# Patient Record
Sex: Female | Born: 1935 | Race: Asian | Hispanic: No | Smoking: Never smoker
Health system: Southern US, Community
[De-identification: ages and names within clinical notes are randomized; demographics above are authoritative.]

## PROBLEM LIST (undated history)

## (undated) DIAGNOSIS — E785 Hyperlipidemia, unspecified: Secondary | ICD-10-CM

## (undated) DIAGNOSIS — E119 Type 2 diabetes mellitus without complications: Secondary | ICD-10-CM

## (undated) DIAGNOSIS — I10 Essential (primary) hypertension: Secondary | ICD-10-CM

## (undated) HISTORY — PX: EYE SURGERY: SHX253

## (undated) HISTORY — PX: CARDIAC CATHETERIZATION: SHX172

## (undated) HISTORY — PX: BACK SURGERY: SHX140

## (undated) HISTORY — PX: KNEE SURGERY: SHX244

---

## 1999-05-14 ENCOUNTER — Other Ambulatory Visit: Admission: RE | Admit: 1999-05-14 | Discharge: 1999-05-14 | Payer: Self-pay | Admitting: Family Medicine

## 1999-08-19 ENCOUNTER — Emergency Department (HOSPITAL_COMMUNITY): Admission: EM | Admit: 1999-08-19 | Discharge: 1999-08-19 | Payer: Self-pay | Admitting: Emergency Medicine

## 1999-08-19 ENCOUNTER — Encounter: Payer: Self-pay | Admitting: Cardiology

## 1999-11-26 ENCOUNTER — Encounter: Payer: Self-pay | Admitting: Family Medicine

## 1999-11-26 ENCOUNTER — Ambulatory Visit (HOSPITAL_COMMUNITY): Admission: RE | Admit: 1999-11-26 | Discharge: 1999-11-26 | Payer: Self-pay | Admitting: Family Medicine

## 2000-02-19 ENCOUNTER — Inpatient Hospital Stay (HOSPITAL_COMMUNITY): Admission: EM | Admit: 2000-02-19 | Discharge: 2000-02-20 | Payer: Self-pay | Admitting: Emergency Medicine

## 2000-02-19 ENCOUNTER — Encounter: Payer: Self-pay | Admitting: Emergency Medicine

## 2000-02-20 ENCOUNTER — Encounter: Payer: Self-pay | Admitting: Endocrinology

## 2001-09-10 ENCOUNTER — Other Ambulatory Visit: Admission: RE | Admit: 2001-09-10 | Discharge: 2001-09-10 | Payer: Self-pay | Admitting: Family Medicine

## 2006-12-07 ENCOUNTER — Encounter: Admission: RE | Admit: 2006-12-07 | Discharge: 2006-12-07 | Payer: Self-pay | Admitting: Orthopedic Surgery

## 2006-12-08 ENCOUNTER — Encounter: Admission: RE | Admit: 2006-12-08 | Discharge: 2006-12-08 | Payer: Self-pay | Admitting: Orthopedic Surgery

## 2006-12-09 ENCOUNTER — Ambulatory Visit (HOSPITAL_BASED_OUTPATIENT_CLINIC_OR_DEPARTMENT_OTHER): Admission: RE | Admit: 2006-12-09 | Discharge: 2006-12-09 | Payer: Self-pay | Admitting: Orthopedic Surgery

## 2009-05-21 ENCOUNTER — Encounter: Admission: RE | Admit: 2009-05-21 | Discharge: 2009-05-21 | Payer: Self-pay | Admitting: Family Medicine

## 2009-12-17 ENCOUNTER — Emergency Department (HOSPITAL_BASED_OUTPATIENT_CLINIC_OR_DEPARTMENT_OTHER): Admission: EM | Admit: 2009-12-17 | Discharge: 2009-12-17 | Payer: Self-pay | Admitting: Emergency Medicine

## 2009-12-17 ENCOUNTER — Ambulatory Visit: Payer: Self-pay | Admitting: Diagnostic Radiology

## 2010-09-17 NOTE — Op Note (Signed)
NAME:  Stacy Reid, Stacy Reid                     ACCOUNT NO.:  [id number]   MEDICAL RECORD NO.:  [id number]          PATIENT TYPE:  AMB   LOCATION:  DSC                          FACILITY:  [facility]   PHYSICIAN:  Mila Homer. Sherlean Foot, M.D. DATE OF BIRTH:  10/08/1932   DATE OF PROCEDURE:  12/09/2006  DATE OF DISCHARGE:                               OPERATIVE REPORT   PREOPERATIVE DIAGNOSIS:  Left patella fracture.   POSTOPERATIVE DIAGNOSIS:  Left patella fracture.   PROCEDURE:  Left patella open reduction and internal fixation.   SURGEON:  Mila Homer. Sherlean Foot, M.D.   ASSISTANT:  None.   ANESTHESIA:  General.   INDICATIONS FOR PROCEDURE:  The patient is a 77 year old female who fell  4 days ago suffering a patella fracture.  Informed consent was obtained.   PROCEDURE:  The patient was placed in supine position and anesthetized  with general anesthesia.  The extremity was exsanguinated with the  Esmarch and the tourniquet inflated to 30 mmHg.   I made a midline incision approximately 4 inches in length.  I used a  fresh blade to go down to and through the paratenon.  I then lavaged out  the knee and cleaned out the fracture site with a rongeur.  Once I had  good bony bleeding edges to the fracture, I reduced it with a towel  clamp.  I then ran two 1.6 mm K-wires in parallel fashion and checked it  under the [facility] device.  I then placed a #16 gauge wire in figure-of-eight  fashion and twisted down and got it tight for an arc of 0 to 60 or 70  degrees of motion, and it was very very stable.  I then curved the  proximal end of the K-wires and pulled them down distally, embedded the  hooks into the quadriceps tendon in parallel fashion and then cut the  pins distally.  I checked on the [facility] imaging to make sure things were  lined up well in AP and lateral images, and then lavaged and closed the  rent in the capsule with #1 Vicryl sutures, the paratenon with #0 Vicryl  suture, deep soft tissue with buried #0  Vicryl, then subcuticular with 2-  0 Vicryl sutures and skin staples, then dressed with Xeroform, sponge,  sterile Webril and an Ace wrap.   Complications: None.  Drains: None.           ______________________________  Mila Homer. Sherlean Foot, M.D.     SDL/MEDQ  D:  12/09/2006  T:  12/10/2006  Job:  [id number]

## 2010-09-20 NOTE — Discharge Summary (Signed)
[place]  Patient:    Stacy Reid, Stacy Reid                            MRN: [id number] Adm. Date:  [id number] Disc. Date: [id number] Attending:  Justine Null Dictator:   Cornell Barman, P.A. CC:         Evette Georges, M.D. [facility]                           Discharge Summary  DISCHARGE DIAGNOSIS:  Chest pain, myocardial infarction ruled out.  HISTORY OF PRESENT ILLNESS:  Stacy Reid is a 77 year old [place] female with no prior history of coronary artery disease.  She presented with episodic chest pain without any associated exertional activity.  The patients pain is substernal, without radiation, rated a 7/10.  She does have some shortness of breath but no nausea, vomiting, or diaphoresis.  PAST MEDICAL HISTORY: 1. Hypertension. 2. History of catheterization in 1995 or 1996, revealing normal coronaries. 3. Long history of anxiety.  HOSPITAL COURSE: #1 - CHEST PAIN WITH HISTORY OF NORMAL CORONARIES IN 1996:  Cardiac enzymes are negative.  EKG is without ischemia.  The patient did rule out for myocardial infarction.  The patient was scheduled for an adenosine Cardiolite. The final results are pending.  If these are negative, she will be discharged home.  #2 - GASTROESOPHAGEAL REFLUX DISEASE:  The patient has been on Vioxx three times a day, which may have produced some gastritis.  The patients Vioxx has been discontinued, and we have given her a prescription of Protonix.  FOLLOW-UP:  With Dr. Tawanna Cooler in the next 7-10 days. DD:  02/20/00 TD:  02/21/00 Job: [id number]

## 2010-09-20 NOTE — Consult Note (Signed)
[place]  Patient:    GOLDYE, TOURANGEAU                            MRN: [id number] Proc. Date: 08/19/99 Adm. Date:  [id number] Attending:  Otilio Connors Iv CC:         Evette Georges, M.D. [facility]             Lesle Chris, M.d.                          Consultation Report  HISTORY OF PRESENT ILLNESS:  Ms. Boteler is a 77 year old [place] female who presents for evaluation of chest pain today.  Initially the patient developed nausea and lightheadedness this morning, followed by symptoms of substernal chest discomfort and some dyspnea.  She was seen in [facility] where an electrocardiogram showed some slight increase in T-wave changes in V3 and V4. he was referred to the emergency room for further evaluation.  The patient currently feels fine.  She is pain-free.  She reports having similar symptoms off and on before.  In fact, she was admitted in January 1996, with chest pain, and had a normal cardiac catheterization at that time.  She denies any vomiting or abdominal pain.  PAST MEDICAL HISTORY:  Hypertension.  PAST SURGICAL HISTORY:  She has had no prior surgery.  ALLERGIES:  No known drug allergies.  CURRENT MEDICATIONS:  Lotensin/HCT for her hypertension.  SOCIAL HISTORY:  The patient is [place]  Her husband expired two months ago. She works at a [employer].  Her son is with her today.  FAMILY HISTORY:  Noncontributory.  REVIEW OF SYSTEMS:  The patient complains of chronic foot pain.  Apparently she was evaluated at a hospital in [place] two months ago for this.  PHYSICAL EXAMINATION:  GENERAL:  The patient is a pleasant [place] female, in no apparent distress.  VITAL SIGNS:  Blood pressure 158/84, pulse 68 and regular, temperature afebrile.  HEENT:  Unremarkable.  She has no jugular venous distention or bruits.  LUNGS:  Clear.  CARDIAC:  A regular rate and rhythm without murmurs, rubs,  gallops, or clicks.  ABDOMEN:  Obese, soft, nontender.  Bowel sounds are positive.  No masses.  EXTREMITIES:  femoral and pedal pulses are 2+ and symmetric.  She has no cyanosis, edema, or phlebitis.  NEUROLOGIC:  Grossly intact.  LABORATORY DATA:  Chest x-ray showed no active disease.  The electrocardiogram shows a normal sinus rhythm with nonspecific T-wave abnormalities.  In fact, reviewing the electrocardiograms from 1995, 1997, and today, there seems to be a varying degree of nonspecific T-wave abnormalities.  Chest x-ray shows no active disease.  CBC, chemistries are normal, with the exception of potassium of 3.4.  Amylase normal, CPK is normal at 77.  Troponin-I pending.  Abdominal ultrasound was obtained today, which demonstrates no gallstones.  The  pancreas is poorly visualized.  No acute abnormality.  IMPRESSION: 1. Atypical chest pain, preceded by nausea.  I doubt this is cardiac, especially    given the fact that she had a normal cardiac catheterization in 1996. Her    nonspecific T-wave abnormalities are chronic.  I suspect this is more related    to gastroesophageal reflux disease, possibly exacerbated by recent stress of  her husband passing away two months ago. 2. Hypertension.  PLAN:  Will start the patient on Protonix 40  mg q.d.  Will discharge to home and have her follow up with her primary physician, Dr. Evette Georges. DD:  08/19/99 TD:  08/19/99 Job: [id number]

## 2011-02-17 LAB — POCT HEMOGLOBIN-HEMACUE
Hemoglobin: 15.2 — ABNORMAL HIGH
Operator id: 123881

## 2011-02-17 LAB — BASIC METABOLIC PANEL
Calcium: 9.7
GFR calc Af Amer: >60
GFR calc non Af Amer: >60
Potassium: 4.2
Sodium: 140

## 2013-11-29 ENCOUNTER — Emergency Department (HOSPITAL_BASED_OUTPATIENT_CLINIC_OR_DEPARTMENT_OTHER)
Admission: EM | Admit: 2013-11-29 | Discharge: 2013-11-29 | Disposition: A | Discharge status: Home / Self Care | Payer: Medicare Other | Attending: Emergency Medicine | Admitting: Emergency Medicine

## 2013-11-29 ENCOUNTER — Emergency Department (HOSPITAL_BASED_OUTPATIENT_CLINIC_OR_DEPARTMENT_OTHER): Payer: Medicare Other

## 2013-11-29 ENCOUNTER — Encounter (HOSPITAL_BASED_OUTPATIENT_CLINIC_OR_DEPARTMENT_OTHER): Payer: Self-pay | Admitting: Emergency Medicine

## 2013-11-29 DIAGNOSIS — R509 Fever, unspecified: Secondary | ICD-10-CM | POA: Insufficient documentation

## 2013-11-29 DIAGNOSIS — I10 Essential (primary) hypertension: Secondary | ICD-10-CM | POA: Insufficient documentation

## 2013-11-29 DIAGNOSIS — J159 Unspecified bacterial pneumonia: Secondary | ICD-10-CM | POA: Insufficient documentation

## 2013-11-29 DIAGNOSIS — E119 Type 2 diabetes mellitus without complications: Secondary | ICD-10-CM | POA: Diagnosis not present

## 2013-11-29 DIAGNOSIS — Z79899 Other long term (current) drug therapy: Secondary | ICD-10-CM | POA: Insufficient documentation

## 2013-11-29 DIAGNOSIS — E785 Hyperlipidemia, unspecified: Secondary | ICD-10-CM | POA: Diagnosis not present

## 2013-11-29 DIAGNOSIS — J189 Pneumonia, unspecified organism: Secondary | ICD-10-CM

## 2013-11-29 HISTORY — DX: Type 2 diabetes mellitus without complications: E11.9

## 2013-11-29 HISTORY — DX: Hyperlipidemia, unspecified: E78.5

## 2013-11-29 HISTORY — DX: Essential (primary) hypertension: I10

## 2013-11-29 LAB — COMPREHENSIVE METABOLIC PANEL
ALT: 6 U/L (ref 0–35)
AST: 23 U/L (ref 0–37)
Albumin: 3.6 g/dL (ref 3.5–5.2)
Alkaline Phosphatase: 83 U/L (ref 39–117)
Anion gap: 14 (ref 5–15)
BUN: 14 mg/dL (ref 6–23)
CALCIUM: 8.7 mg/dL (ref 8.4–10.5)
CO2: 24 meq/L (ref 19–32)
Chloride: 96 mEq/L (ref 96–112)
Creatinine, Ser: 0.9 mg/dL (ref 0.50–1.10)
GFR calc Af Amer: 69 mL/min — ABNORMAL LOW (ref >90)
GFR, EST NON AFRICAN AMERICAN: 60 mL/min — AB (ref >90)
Glucose, Bld: 341 mg/dL — ABNORMAL HIGH (ref 70–99)
Potassium: 3.9 mEq/L (ref 3.7–5.3)
SODIUM: 134 meq/L — AB (ref 137–147)
Total Bilirubin: 0.5 mg/dL (ref 0.3–1.2)
Total Protein: 7.6 g/dL (ref 6.0–8.3)

## 2013-11-29 LAB — CBC
HCT: 43.4 % (ref 36.0–46.0)
Hemoglobin: 14.5 g/dL (ref 12.0–15.0)
MCH: 30 pg (ref 26.0–34.0)
MCHC: 33.4 g/dL (ref 30.0–36.0)
MCV: 89.9 fL (ref 78.0–100.0)
PLATELETS: 216 10*3/uL (ref 150–400)
RBC: 4.83 MIL/uL (ref 3.87–5.11)
RDW: 12.2 % (ref 11.5–15.5)
WBC: 11.2 10*3/uL — AB (ref 4.0–10.5)

## 2013-11-29 LAB — URINALYSIS, ROUTINE W REFLEX MICROSCOPIC
BILIRUBIN URINE: NEGATIVE
GLUCOSE, UA: NEGATIVE mg/dL
HGB URINE DIPSTICK: NEGATIVE
Ketones, ur: 15 mg/dL — AB
Leukocytes, UA: NEGATIVE
NITRITE: NEGATIVE
PH: 5.5 (ref 5.0–8.0)
Protein, ur: 100 mg/dL — AB
SPECIFIC GRAVITY, URINE: 1.023 (ref 1.005–1.030)
Urobilinogen, UA: 1 mg/dL (ref 0.0–1.0)

## 2013-11-29 LAB — I-STAT CG4 LACTIC ACID, ED: Lactic Acid, Venous: 1.75 mmol/L (ref 0.5–2.2)

## 2013-11-29 LAB — URINE MICROSCOPIC-ADD ON

## 2013-11-29 LAB — CBG MONITORING, ED: Glucose-Capillary: 265 mg/dL — ABNORMAL HIGH (ref 70–99)

## 2013-11-29 MED ORDER — AZITHROMYCIN 250 MG PO TABS: 250.0000 mg | ORAL_TABLET | Freq: Every day | ORAL | Status: DC

## 2013-11-29 MED ORDER — AMOXICILLIN 500 MG PO CAPS: 1000.0000 mg | ORAL_CAPSULE | Freq: Two times a day (BID) | ORAL | Status: DC

## 2013-11-29 MED ORDER — SODIUM CHLORIDE 0.9 % IV BOLUS (SEPSIS)
1000.0000 mL | Freq: Once | INTRAVENOUS | Status: AC
  Administered 2013-11-29: 1000 mL via INTRAVENOUS

## 2013-11-29 MED ORDER — AZITHROMYCIN 250 MG PO TABS
500.0000 mg | ORAL_TABLET | Freq: Once | ORAL | Status: AC
  Administered 2013-11-29: 500 mg via ORAL
  Filled 2013-11-29: qty 2

## 2013-11-29 MED ORDER — DEXTROSE 5 % IV SOLN: 1.0000 g | Freq: Once | INTRAVENOUS | Status: DC

## 2013-11-29 MED ORDER — IBUPROFEN 400 MG PO TABS
600.0000 mg | ORAL_TABLET | Freq: Once | ORAL | Status: AC
  Administered 2013-11-29: 600 mg via ORAL
  Filled 2013-11-29 (×2): qty 1

## 2013-11-29 MED ORDER — CEFTRIAXONE SODIUM 1 G IJ SOLR
INTRAMUSCULAR | Status: AC
  Administered 2013-11-29: 1000 mg
  Filled 2013-11-29: qty 10

## 2013-11-29 NOTE — ED Notes (Signed)
Patient transported to X-ray 

## 2013-11-29 NOTE — ED Notes (Signed)
Fever that started 2 days ago.  Traveled internationally from [place] 6 days ago and was well.  Abdominal and back pain, vomited x 1 this am.

## 2013-11-29 NOTE — ED Provider Notes (Signed)
CSN: [id number]     Arrival date & time 11/29/13  1478 History   First MD Initiated Contact with Patient 11/29/13 1009     Chief Complaint  Patient presents with  . Fever     (Consider location/radiation/quality/duration/timing/severity/associated sxs/prior Treatment) HPI Pt presenting with fever, symptoms began 2 days ago.  She denies other symptoms including no sore throat, no cough.  She states that she felt nauseated and had one episode of emesis.  Nonbloody and nonbilious.  No diarrhea.  Currently denies pain in abdomen.  She recently traveled back from [place], no signs of illness there, no specific known sick contacts.  There are no other associated systemic symptoms, there are no other alleviating or modifying factors.   Past Medical History  Diagnosis Date  . Diabetes mellitus without complication   . Hypertension   . Hyperlipidemia    Past Surgical History  Procedure Laterality Date  . Knee surgery    . Eye surgery    . Cardiac catheterization     No family history on file. History  Substance Use Topics  . Smoking status: Never Smoker   . Smokeless tobacco: Not on file  . Alcohol Use: No   OB History   Grav Para Term Preterm Abortions TAB SAB Ect Mult Living                 Review of Systems ROS reviewed and all otherwise negative except for mentioned in HPI    Allergies  Review of patient's allergies indicates no known allergies.  Home Medications   Prior to Admission medications   Medication Sig Start Date End Date Taking? Authorizing Provider  losartan (COZAAR) 50 MG tablet Take 50 mg by mouth daily.   Yes Historical Provider, MD  metFORMIN (GLUCOPHAGE) 1000 MG tablet Take 1,000 mg by mouth 2 (two) times daily with a meal.   Yes Historical Provider, MD  pravastatin (PRAVACHOL) 40 MG tablet Take 40 mg by mouth daily.   Yes Historical Provider, MD  amoxicillin (AMOXIL) 500 MG capsule Take 2 capsules (1,000 mg total) by mouth 2 (two) times daily. 11/29/13    Ethelda Chick, MD  azithromycin (ZITHROMAX) 250 MG tablet Take 1 tablet (250 mg total) by mouth daily. Take 1 tab po every day until finished. 11/29/13   Ethelda Chick, MD   BP 138/70  Pulse 84  Temp(Src) 100.1 F (37.8 C) (Oral)  Resp 16  SpO2 98% Vitals reviewed Physical Exam Physical Examination: General appearance - alert, well appearing, and in no distress Mental status - alert, oriented to person, place, and time Eyes - no conjunctival injection, no scleral icterus Mouth - mucous membranes moist, pharynx normal without lesions Chest - clear to auscultation, no wheezes, rales or rhonchi, symmetric air entry, no increased respiratory effort Heart - normal rate, regular rhythm, normal S1, S2, no murmurs, rubs, clicks or gallops Abdomen - soft, nontender, nondistended, no masses or organomegaly Extremities - peripheral pulses normal, no pedal edema, no clubbing or cyanosis Skin - normal coloration and turgor, no rashes  ED Course  Procedures (including critical care time) Labs Review Labs Reviewed  URINALYSIS, ROUTINE W REFLEX MICROSCOPIC - Abnormal; Notable for the following:    APPearance CLOUDY (*)    Ketones, ur 15 (*)    Protein, ur 100 (*)    All other components within normal limits  URINE MICROSCOPIC-ADD ON - Abnormal; Notable for the following:    Squamous Epithelial / LPF FEW (*)  Bacteria, UA FEW (*)    All other components within normal limits  CBC - Abnormal; Notable for the following:    WBC 11.2 (*)    All other components within normal limits  COMPREHENSIVE METABOLIC PANEL - Abnormal; Notable for the following:    Sodium 134 (*)    Glucose, Bld 341 (*)    GFR calc non Af Amer 60 (*)    GFR calc Af Amer 69 (*)    All other components within normal limits  CBG MONITORING, ED - Abnormal; Notable for the following:    Glucose-Capillary 265 (*)    All other components within normal limits  URINE CULTURE  CULTURE, BLOOD (ROUTINE X 2)  CULTURE, BLOOD  (ROUTINE X 2)  I-STAT CG4 LACTIC ACID, ED    Imaging Review Dg Chest 2 View  11/29/2013   CLINICAL DATA:  Fever.  Lethargic.  Diabetes.  EXAM: CHEST  2 VIEW  COMPARISON:  08/28/2011.  FINDINGS: Anterior LEFT lower lobe airspace disease is present, best seen on the lateral view. This is compatible with pneumonia. There is no pleural effusion. The RIGHT lung appears clear. Cardiopericardial silhouette within normal limits. Aortic arch atherosclerosis.  IMPRESSION: Anterior basal LEFT lower lobe pneumonia. Followup in 4-6 weeks to ensure radiographic clearing and exclude an underlying lesion is recommended.   Electronically Signed   By: Andreas NewportGeoffrey  Lamke M.D.   On: 11/29/2013 11:35     EKG Interpretation None      MDM   Final diagnoses:  Community acquired pneumonia    Pt presenting with fever.  CXR shows area of pneumonia.   Xray images reviewed and interpreted by me as well.  Pt received zithromax and rocephin in the ED.  Pt feeling improved after some fluids and meds in the ED.  Discharged with strict return precautions.  Pt agreeable with plan.     Ethelda ChickMartha K Linker, MD 12/01/13 [phone]

## 2013-11-29 NOTE — ED Notes (Signed)
MD at bedside. 

## 2013-11-29 NOTE — Discharge Instructions (Signed)
Return to the ED with any concerns including difficulty breathing, vomiting and not able to keep down liquids or antibiotics, decreased level of alertness/lethargy, or any other alarming symptoms  Vim ph?i (Pneumonia) Vim ph?i l b?nh nhi?m trng ph?i.  NGUYN NHN Vim ph?i c th? do vi khu?n ho?c vi rt gy ra. Thng th??ng, cc nhi?m trng ny x?y ra do ht cc h?t mang m?m b?nh vo ph?i (???ng h h?p). D?U HI?U V TRI?U CH?NG   Ho.  S?t.  ?au ng?c.  Nh?p th? t?ng ln.  Th? kh kh.  S?n sinh d?ch nh?y. CH?N ?ON  N?u qu v? c cc tri?u ch?ng ph? bi?n c?a b?nh vim ph?i, chuyn gia ch?m [place] s?c kh?e c?a qu v? th??ng s? xc nh?n ch?n ?on b?ng X-quang ph?i. X-quang s? hi?n th? ph?n b?t th??ng trong ph?i (thm nhi?m ph?i) n?u qu v? b? vim ph?i. Cc xt nghi?m mu, n??c ti?u ho?c ??m khc c th? ???c th?c hi?n ?? tm ra nguyn nhn c? th? gy b?nh vim ph?i c?a qu v?. Chuyn gia ch?m [place] s?c kh?e c?a qu v? c th? c?ng lm cc xt nghi?m (kh mu ho?c ?o ?? bo ha oxy trong mu) ?? xem ph?i c?a qu v? ?ang ho?t ??ng nh? th? no. ?I?U TR?  M?t s? d?ng vim ph?i c th? ly lan sang ng??i khc khi qu v? ho ho?c h?t h?i. Qu v? c th? ???c yu c?u ?eo kh?u trang tr??c v trong qu trnh khm. Vim ph?i do vi khu?n gy ra ???c ?i?u tr? b?ng thu?c khng sinh. Vim ph?i do vi rt cm gy ra c th? ???c ?i?u tr? b?ng thu?c khng vi rt. H?u h?t cc b?nh nhi?m trng do vi rt khc ph?i ?i h?t ti?n trnh c?a b?nh. Cc b?nh nhi?m trng ny s? khng ?p ?ng v?i thu?c khng sinh.  H??NG D?N CH?M [place] T?I NH   Thu?c ho c th? ???c s? d?ng n?u qu v? khng ???c ngh? ng?i nhi?u. Tuy nhin, ho b?o v? qu v? b?ng cch lm s?ch ph?i. Qu v? nn trnh s? d?ng thu?c ho n?u c th?Shaune Pascal.  Chuyn gia ch?m [place] s?c kh?e c?a qu v? c th? ? k ??n thu?c n?u ngh? r?ng qu v? b? vim ph?i do vi khu?n ho?c do cm. Hy dng h?t thu?c ngay c? khi qu v? b?t ??u c?m th?y kh h?n.  Chuyn gia ch?m [place] s?c kh?e c?ng  c th? k ??n thu?c long ??m. Thu?c ny lm l?ng d?ch nh?y ?? ho ra ngoi.  Ch? s? d?ng thu?c theo ch? d?n c?a chuyn gia ch?m [place] s?c kh?e.  Khng ht thu?c. Ht thu?c l l nguyn nhn ph? bi?n gy vim ph? qu?n v c th? gp ph?n gy vim ph?i. N?u qu v? ht thu?c v ti?p t?c ht thu?c, ch?ng ho c?a qu v? c th? ko di vi tu?n sau khi h?t vim ph?i.  My phun h?i n??c mt ho?c my t?o h?i ?m trong phng ho?c trong nh qu v? c th? gip lm l?ng d?ch nh?y.  Ho th??ng n?ng h?n vo ban ?m. Ng? ? t? th? n?a n?m n?a ng?i trong m?t chi?c gh? t?a ho?c s? d?ng m?t ?i g?i d??i ??u s? gip lm d?u b?t v?n ?? ny.  Ngh? ng?i khi qu v? c?m th?y c?n. C? th? c?a qu v? th??ng s? cho qu v? bi?t khi no c?n ngh? ng?i. PHNG NG?A C th? tim (v?c xin) ph? c?u khu?n ??  trnh vi khu?n thng th??ng gy vim ph?i. Tim v?c xin th??ng ???c ?? ngh? cho:  Nh?ng ng??i trn 67 tu?i.  B?nh nhn ?ang dng ha tr? li?u.  Nh?ng ng??i c v?n ?? v? ph?i m?n tnh, ch?ng h?n nh? vim ti?u ph? qu?n ho?c kh ph? th?ng.  Nh?ng ng??i c v?n ?? v? h? th?ng mi?n d?ch. N?u qu v? trn 67 tu?i ho?c c tnh tr?ng nguy c? cao, qu v? c th? ???c tim v?cxin ph? c?u khu?n n?u qu v? ch?a ???c tim tr??c ?. ? m?t s? n??c, v?cxin cm thng th??ng c?ng ???c ?? ngh?Marland Kitchen V?cxin ny c th? gip phng ng?a m?t s? tr??ng h?p vim ph?i. Qu v? c th? s? ???c cung c?p v?cxin cm nh? l m?t ph?n c?a d?ch v? ch?m [place]. N?u qu v? ht thu?c, ? t?i lc b? thu?c. Qu v? c th? ???c h??ng d?n v? cch d?ng ht thu?c. Chuyn gia ch?m [place] s?c kh?e c?a qu v? c th? cung c?p thu?c v t? v?n ?? gip qu v? b? thu?c l. ?I KHM N?U: Qu v? b? s?t. NGAY L?P T?C ?I KHM N?U:   B?nh c?a qu v? tr? nn t? h?n. ?i?u ny ??c bi?t ?ng n?u qu v? cao tu?i ho?c ? b? y?u do b?t k? b?nh no khc.  Qu v? khng th? ki?m sot c?n ho c?a mnh b?ng thu?c ho v ?ang b? m?t ng?.  Qu v? b?t ??u ho ra mu.  Qu v? b? ?au v tr? nn n?ng h?n ho?c khng  ki?m sot ???c b?ng thu?c.  B?t k? tri?u ch?ng no ban ??u khi?n qu v? ph?i ??n ?i?u tr? tr? nn nghim tr?ng h?n thay v t?t h?n.  Qu v? b? kh th? ho?c ?au ng?c. ??M B?O QU V?:   Hi?u r cc h??ng d?n ny.  S? theo di tnh tr?ng c?a mnh.  S? yu c?u tr? gip ngay l?p t?c n?u qu v? c?m th?y khng kh?e ho?c th?y tr?m tr?ng h?n. Document Released: 04/21/2005 Document Revised: 09/05/2013 [facility] Patient Information 2015 [place], [place]. This information is not intended to replace advice given to you by your health care provider. Make sure you discuss any questions you have with your health care provider.

## 2013-11-30 LAB — URINE CULTURE

## 2013-12-01 ENCOUNTER — Telehealth (HOSPITAL_COMMUNITY): Payer: Self-pay

## 2013-12-01 NOTE — ED Notes (Signed)
Post ED Visit - Positive Culture Follow-up  Culture report reviewed by antimicrobial stewardship pharmacist: []  Wes Dulaney, Pharm.D., BCPS [x]  Celedonio MiyamotoJeremy Frens, Pharm.D., BCPS []  Georgina PillionElizabeth Martin, Pharm.D., BCPS []  DaleMinh Pham, [address] BoulevardPharm.D., BCPS, AAHIVP []  Estella HuskMichelle Turner, Pharm.D., BCPS, AAHIVP []    Positive urine culture Treated with amoxicillin, organism sensitive to the same and no further patient follow-up is required at this time.  Ashley JacobsFesterman, Jamar Weatherall C 12/01/2013, 10:32 AM

## 2013-12-05 ENCOUNTER — Encounter (HOSPITAL_BASED_OUTPATIENT_CLINIC_OR_DEPARTMENT_OTHER): Payer: Self-pay | Admitting: Emergency Medicine

## 2013-12-05 ENCOUNTER — Emergency Department (HOSPITAL_BASED_OUTPATIENT_CLINIC_OR_DEPARTMENT_OTHER)
Admission: EM | Admit: 2013-12-05 | Discharge: 2013-12-05 | Disposition: A | Discharge status: Home / Self Care | Payer: Medicare Other | Attending: Emergency Medicine | Admitting: Emergency Medicine

## 2013-12-05 ENCOUNTER — Emergency Department (HOSPITAL_BASED_OUTPATIENT_CLINIC_OR_DEPARTMENT_OTHER): Payer: Medicare Other

## 2013-12-05 DIAGNOSIS — R0789 Other chest pain: Secondary | ICD-10-CM | POA: Diagnosis not present

## 2013-12-05 DIAGNOSIS — Z9889 Other specified postprocedural states: Secondary | ICD-10-CM | POA: Insufficient documentation

## 2013-12-05 DIAGNOSIS — E119 Type 2 diabetes mellitus without complications: Secondary | ICD-10-CM | POA: Diagnosis not present

## 2013-12-05 DIAGNOSIS — R0602 Shortness of breath: Secondary | ICD-10-CM | POA: Diagnosis present

## 2013-12-05 DIAGNOSIS — I10 Essential (primary) hypertension: Secondary | ICD-10-CM | POA: Insufficient documentation

## 2013-12-05 DIAGNOSIS — Z79899 Other long term (current) drug therapy: Secondary | ICD-10-CM | POA: Insufficient documentation

## 2013-12-05 DIAGNOSIS — R5383 Other fatigue: Secondary | ICD-10-CM

## 2013-12-05 DIAGNOSIS — E785 Hyperlipidemia, unspecified: Secondary | ICD-10-CM | POA: Diagnosis not present

## 2013-12-05 DIAGNOSIS — R110 Nausea: Secondary | ICD-10-CM | POA: Insufficient documentation

## 2013-12-05 DIAGNOSIS — R42 Dizziness and giddiness: Secondary | ICD-10-CM | POA: Insufficient documentation

## 2013-12-05 DIAGNOSIS — Z792 Long term (current) use of antibiotics: Secondary | ICD-10-CM | POA: Diagnosis not present

## 2013-12-05 DIAGNOSIS — R5381 Other malaise: Secondary | ICD-10-CM | POA: Insufficient documentation

## 2013-12-05 LAB — TROPONIN I

## 2013-12-05 LAB — COMPREHENSIVE METABOLIC PANEL
ALBUMIN: 3.7 g/dL (ref 3.5–5.2)
ALT: 17 U/L (ref 0–35)
ANION GAP: 13 (ref 5–15)
AST: 35 U/L (ref 0–37)
Alkaline Phosphatase: 83 U/L (ref 39–117)
BUN: 10 mg/dL (ref 6–23)
CALCIUM: 9.6 mg/dL (ref 8.4–10.5)
CO2: 27 meq/L (ref 19–32)
CREATININE: 0.7 mg/dL (ref 0.50–1.10)
Chloride: 98 mEq/L (ref 96–112)
GFR calc Af Amer: >90 mL/min (ref >90)
GFR, EST NON AFRICAN AMERICAN: 81 mL/min — AB (ref >90)
Glucose, Bld: 233 mg/dL — ABNORMAL HIGH (ref 70–99)
Potassium: 3.9 mEq/L (ref 3.7–5.3)
Sodium: 138 mEq/L (ref 137–147)
Total Bilirubin: 0.4 mg/dL (ref 0.3–1.2)
Total Protein: 7.8 g/dL (ref 6.0–8.3)

## 2013-12-05 LAB — CBC WITH DIFFERENTIAL/PLATELET
BASOS ABS: 0 10*3/uL (ref 0.0–0.1)
BASOS PCT: 0 % (ref 0–1)
EOS PCT: 1 % (ref 0–5)
Eosinophils Absolute: 0.1 10*3/uL (ref 0.0–0.7)
HEMATOCRIT: 44.8 % (ref 36.0–46.0)
Hemoglobin: 15.4 g/dL — ABNORMAL HIGH (ref 12.0–15.0)
Lymphocytes Relative: 29 % (ref 12–46)
Lymphs Abs: 2.5 10*3/uL (ref 0.7–4.0)
MCH: 29.8 pg (ref 26.0–34.0)
MCHC: 34.4 g/dL (ref 30.0–36.0)
MCV: 86.8 fL (ref 78.0–100.0)
MONO ABS: 0.5 10*3/uL (ref 0.1–1.0)
Monocytes Relative: 5 % (ref 3–12)
Neutro Abs: 5.6 10*3/uL (ref 1.7–7.7)
Neutrophils Relative %: 65 % (ref 43–77)
Platelets: 286 10*3/uL (ref 150–400)
RBC: 5.16 MIL/uL — ABNORMAL HIGH (ref 3.87–5.11)
RDW: 11.9 % (ref 11.5–15.5)
WBC: 8.6 10*3/uL (ref 4.0–10.5)

## 2013-12-05 LAB — URINALYSIS, ROUTINE W REFLEX MICROSCOPIC
Bilirubin Urine: NEGATIVE
GLUCOSE, UA: NEGATIVE mg/dL
Hgb urine dipstick: NEGATIVE
Ketones, ur: NEGATIVE mg/dL
LEUKOCYTES UA: NEGATIVE
NITRITE: NEGATIVE
PROTEIN: NEGATIVE mg/dL
Specific Gravity, Urine: 1.008 (ref 1.005–1.030)
Urobilinogen, UA: 0.2 mg/dL (ref 0.0–1.0)
pH: 7 (ref 5.0–8.0)

## 2013-12-05 LAB — CULTURE, BLOOD (ROUTINE X 2): CULTURE: NO GROWTH

## 2013-12-05 MED ORDER — MECLIZINE HCL 25 MG PO TABS: 25.0000 mg | ORAL_TABLET | Freq: Three times a day (TID) | ORAL | Status: DC | PRN

## 2013-12-05 MED ORDER — ONDANSETRON 8 MG PO TBDP: 8.0000 mg | ORAL_TABLET | Freq: Three times a day (TID) | ORAL | Status: DC | PRN

## 2013-12-05 MED ORDER — LORAZEPAM 2 MG/ML IJ SOLN
0.5000 mg | Freq: Once | INTRAMUSCULAR | Status: AC
  Administered 2013-12-05: 0.5 mg via INTRAVENOUS
  Filled 2013-12-05: qty 1

## 2013-12-05 MED ORDER — MECLIZINE HCL 25 MG PO TABS
25.0000 mg | ORAL_TABLET | Freq: Once | ORAL | Status: AC
  Administered 2013-12-05: 25 mg via ORAL
  Filled 2013-12-05: qty 1

## 2013-12-05 MED ORDER — ONDANSETRON HCL 4 MG/2ML IJ SOLN
4.0000 mg | Freq: Once | INTRAMUSCULAR | Status: AC
  Administered 2013-12-05: 4 mg via INTRAVENOUS
  Filled 2013-12-05: qty 2

## 2013-12-05 MED ORDER — SODIUM CHLORIDE 0.9 % IV BOLUS (SEPSIS)
500.0000 mL | Freq: Once | INTRAVENOUS | Status: AC
  Administered 2013-12-05: 500 mL via INTRAVENOUS

## 2013-12-05 NOTE — ED Notes (Signed)
Attempted to ambulate patient with unsteady gait increased dizziness and reported to RN and PA.

## 2013-12-05 NOTE — ED Notes (Signed)
SHOB x 2 days and feeling dizzy and unable maintain balance.  Seen here 11/29/2013 for CAP and d/c'd home.

## 2013-12-05 NOTE — Discharge Instructions (Signed)
Stacy Reid (Vertigo) Chng Reid c ngh?a l b?n c?m th?y nh? b?n hay m?i th? xung quanh b?n ?ang di chuy?n khi th?c t? khng ph?i nh? v?y. Chng Reid c th? nguy hi?m n?u n x?y ra khi b?n ?ang ? n?i lm vi?c, li xe ho?c th?c hi?n cc ho?t ??ng kh kh?n. NGUYN NHN Chng Reid x?y ra khi c s? xung ??t c?a cc tn hi?u ???c g?i ln no t? h? th?ng th? gic v h? th?ng c?m gic trong c? th?. C nhi?u nguyn nhn khc nhau gy chng Reid, bao g?m:  Nhi?m trng, ??c bi?t l ? tai trong.  Ph?n ?ng x?u v?i Reid lo?i thu?c ho?c l?m d?ng r??u v thu?c men.  Cai ma ty ho?c r??u.  Thay ??i t? th? nhanh, Stacy h?n nh? n?m xu?ng ho?c l?n mnh trn gi??ng.  ?au n?a ??u.  L?u l??ng mu ln no gi?m.  p l?c trong no t?ng sau ch?n th??ng ??u, nhi?m trng, kh?i u ho?c ch?y mu. TRI?U Stacy B?n c th? c?m th?y c v? nh? l th? gi?i ?ang quay cu?ng ho?c b?n ?ang r?i xu?ng ??t. B?i v s? cn b?ng c?a b?n b? ??o l?n, chng Reid c th? khi?n b?n bu?n nn v nn m?a. B?n c th? c c? ??ng Reid ngoi  mu?n (gi?t c?u Reid). CH?N ?ON Chng Reid th??ng ???c ch?n ?on b?ng cch khm th?c th?. N?u khng xc ??nh ???c nguyn nhn gy chng Reid, chuyn gia ch?m [place] s?c kh?e c th? th?c hi?n cc xt nghi?m hnh ?nh, Stacy h?n nh? ch?p MRI (ch?p c?ng h??ng t?). ?I?U TR? H?u h?t cc tr??ng h?p chng Reid t? chng h?t m khng c?n ?i?u tr?Marland Kitchen Ty thu?c vo nguyn nhn, chuyn gia ch?m [place] s?c kh?e c th? k Reid s? thu?c nh?t ??nh. N?u chng Reid c lin quan ??n cc v?n ?? v? v? tr c? th?, chuyn gia ch?m [place] s?c kh?e c th? ?? xu?t cc ??ng tc ho?c ph??ng php ?? kh?c ph?c v?n ?? ny. Trong Reid s? tr??ng h?p hi?m g?p, n?u chng m?t do Reid s? v?n ?? v? tai trong, b?n c th? c?n ph?u thu?t. H??NG D?N CH?M [place] T?I NH  Lm theo h??ng d?n c?a bc s?Hessie Diener li xe.  Trnh v?n hnh my mc n?ng.  Trnh th?c hi?n b?t c? nhi?m v? no c th? gy nguy hi?m cho b?n ho?c nh?ng ng??i khc trong tnh tr?ng chng Reid.  Ni cho  chuyn gia ch?m [place] s?c kh?e bi?t n?u b?n nh?n th?y lo?i thu?c no ? d??ng nh? l nguyn nhn gy chng Reid cho b?n. Reid s? thu?c ???c dng ?? ?i?u tr? tnh tr?ng chng Reid th?c s? c th? lm cho chng t?i t? h?n ? Reid s? ng??i. HY NGAY L?P T?C ?I KHM N?U:  Thu?c c v? nh? khng c tc d?ng lm h?t c?n chng Reid ho?c lm b?nh n?ng thm.  B?n c v?n ?? v? ni chuy?n, ?i b?, ?m y?u ho?c vi?c s? d?ng cnh tay, bn tay hay chn.  B?n b? ?au ??u r?t nhi?u.  Bu?n nn ho?c nn lin t?c ho?c n?ng h?n.  B?n b? thay ??i th? gic.  Thnh vin trong gia ?nh nh?n th?y cc thay ??i hnh vi.  Tnh tr?ng c?a b?n tr? nn n?ng h?n. ??M B?O B?N:  Hi?u cc h??ng d?n ny.  S? theo di tnh tr?ng c?a mnh.  S? yu c?u tr? gip ngay l?p t?c n?u  b?n c?m th?y khng ?? ho?c tnh tr?ng tr?m tr?ng h?n. Document Released: 04/21/2005 Document Revised: 12/22/2012 [facility] Patient Information 2015 [place]. This information is not intended to replace advice given to you by your health care provider. Make sure you discuss any questions you have with your health care provider.

## 2013-12-05 NOTE — ED Provider Notes (Signed)
CSN: [id number]     Arrival date & time 12/05/13  1034 History   First MD Initiated Contact with Patient 12/05/13 1039     Chief Complaint  Patient presents with  . Shortness of Breath     (Consider location/radiation/quality/duration/timing/severity/associated sxs/prior Treatment) Patient is a 80 y.o. female presenting with dizziness. The history is provided by the patient and a relative. A language interpreter was used Garment/textile technologist is daughter at bedside. ).  Dizziness Quality:  Room spinning Severity:  Moderate Onset quality:  Gradual Duration:  2 days Timing:  Constant Associated symptoms: nausea and shortness of breath   Associated symptoms: no chest pain, no headaches and no vomiting   Associated symptoms comment:  She reports dizziness, like the room is spinning, worsening over the last 2 days. It is associated with change in position, and nausea without vomiting. No history of vertigo. No falls or syncope. She was treated in July for pneumonia and states that the symptoms of cough and chest tightness resolved with her antibiotics. Today she is having some SOB and chest tightness when she is dizzy but no further cough. No recent fever.  Risk factors: no hx of stroke and no hx of vertigo     Past Medical History  Diagnosis Date  . Diabetes mellitus without complication   . Hypertension   . Hyperlipidemia    Past Surgical History  Procedure Laterality Date  . Knee surgery    . Eye surgery    . Cardiac catheterization     No family history on file. History  Substance Use Topics  . Smoking status: Never Smoker   . Smokeless tobacco: Not on file  . Alcohol Use: No   OB History   Grav Para Term Preterm Abortions TAB SAB Ect Mult Living                 Review of Systems  Constitutional: Negative for fever.  HENT: Negative for congestion and sinus pressure.   Respiratory: Positive for chest tightness and shortness of breath. Negative for cough.   Cardiovascular:  Negative for chest pain.  Gastrointestinal: Positive for nausea. Negative for vomiting and abdominal pain.  Musculoskeletal: Negative for myalgias.  Skin: Negative for rash.  Neurological: Positive for dizziness and weakness. Negative for syncope, speech difficulty and headaches.  Psychiatric/Behavioral: Negative for confusion.      Allergies  Review of patient's allergies indicates no known allergies.  Home Medications   Prior to Admission medications   Medication Sig Start Date End Date Taking? Authorizing Provider  losartan (COZAAR) 50 MG tablet Take 50 mg by mouth daily.   Yes Historical Provider, MD  amoxicillin (AMOXIL) 500 MG capsule Take 2 capsules (1,000 mg total) by mouth 2 (two) times daily. 11/29/13   Ethelda Chick, MD  azithromycin (ZITHROMAX) 250 MG tablet Take 1 tablet (250 mg total) by mouth daily. Take 1 tab po every day until finished. 11/29/13   Ethelda Chick, MD  metFORMIN (GLUCOPHAGE) 1000 MG tablet Take 1,000 mg by mouth 2 (two) times daily with a meal.    Historical Provider, MD  pravastatin (PRAVACHOL) 40 MG tablet Take 40 mg by mouth daily.    Historical Provider, MD   BP 168/89  Pulse 60  Temp(Src) 98.2 F (36.8 C) (Oral)  Resp 16  Ht 5\' 2"  (1.575 m)  Wt 150 lb (68.04 kg)  BMI 27.43 kg/m2  SpO2 98% Physical Exam  Constitutional: She is oriented to person, place, and time. She  appears well-developed and well-nourished.  HENT:  Head: Normocephalic.  Neck: Normal range of motion. Neck supple.  Cardiovascular: Normal rate and regular rhythm.   Pulmonary/Chest: Effort normal and breath sounds normal.  Abdominal: Soft. Bowel sounds are normal. There is no tenderness. There is no rebound and no guarding.  Musculoskeletal: Normal range of motion.  Neurological: She is alert and oriented to person, place, and time. She has normal strength. Coordination normal. GCS eye subscore is 4. GCS verbal subscore is 5. GCS motor subscore is 6.  Speech is clear and  focused, coordination without deficit. There is horizontal nystagmus present. CN's 3-12 grossly intact.   Skin: Skin is warm and dry. No rash noted.  Psychiatric: She has a normal mood and affect.    ED Course  Procedures (including critical care time) Labs Review Labs Reviewed  CBC WITH DIFFERENTIAL - Abnormal; Notable for the following:    RBC 5.16 (*)    Hemoglobin 15.4 (*)    All other components within normal limits  COMPREHENSIVE METABOLIC PANEL - Abnormal; Notable for the following:    Glucose, Bld 233 (*)    GFR calc non Af Amer 81 (*)    All other components within normal limits  TROPONIN I  URINALYSIS, ROUTINE W REFLEX MICROSCOPIC    Imaging Review Dg Chest 2 View  12/05/2013   CLINICAL DATA:  Shortness of breath, chest pain  EXAM: CHEST  2 VIEW  COMPARISON:  11/29/2013  FINDINGS: Borderline cardiomegaly is noted. There is mild central vascular congestion but no overt edema. No focal pulmonary opacity is identified. The previously seen finding may have represented atelectasis. No pleural effusion. No acute osseous finding. Bilateral AC joint degenerative change.  IMPRESSION: Borderline cardiomegaly with apparent interval resolution of the previously seen left lower lobe airspace opacity. No new focal pulmonary opacity.   Electronically Signed   By: Christiana PellantGretchen  Green M.D.   On: 12/05/2013 12:01   Ct Head Wo Contrast  12/05/2013   CLINICAL DATA:  Vertigo  EXAM: CT HEAD WITHOUT CONTRAST  TECHNIQUE: Contiguous axial images were obtained from the base of the skull through the vertex without intravenous contrast.  COMPARISON:  None.  FINDINGS: The bony calvarium is intact. No gross soft tissue abnormality is seen. Mild atrophic changes are noted commensurate the patient's given age. No findings to suggest acute hemorrhage, acute infarction or space-occupying mass lesion are noted.  IMPRESSION: Chronic changes without acute abnormality.   Electronically Signed   By: Alcide CleverMark  Lukens M.D.   On:  12/05/2013 11:51     EKG Interpretation   Date/Time:  Monday December 05 2013 11:20:06 EDT Ventricular Rate:  64 PR Interval:  188 QRS Duration: 86 QT Interval:  432 QTC Calculation: 445 R Axis:   -7 Text Interpretation:  Normal sinus rhythm Left ventricular hypertrophy  with repolarization abnormality Abnormal ECG Confirmed by DELOS  MD,  DOUGLAS ([id number]) on 12/05/2013 12:17:49 PM      MDM   Final diagnoses:  None    1. Vertigo  No evidence of stroke on CT in a patient with exam consistent with vertigo based on history, non-focal neuro exam, improved with Antivert. Interim clearing of pneumonia on CXR. Patient examined by Dr. Judd Lienelo and felt stable for discharge.     Stacy HookerShari A Divit Stipp, PA-C 12/05/13 1316

## 2013-12-05 NOTE — ED Notes (Signed)
Leonia ReaderS. Upstill, PA-C at bedside.  Family at bedside.

## 2013-12-06 LAB — CULTURE, BLOOD (ROUTINE X 2): Culture: NO GROWTH

## 2013-12-06 NOTE — ED Provider Notes (Signed)
Medical screening examination/treatment/procedure(s) were conducted as a shared visit with non-physician practitioner(s) and myself.  I personally evaluated the patient during the encounter. Patient is a 80 year old female who presents with complaints of dizziness. She describes this as a room spinning. This is worse with movement and turning her head. It is relieved with rest.  On exam, vitals are stable and the patient is afebrile. Head is atraumatic, normocephalic. Neck is supple. Heart is regular rate and rhythm and lungs are clear. Abdomen is benign. Neurologically cranial nerves II through XII are grossly intact without focal deficits. There is a slight horizontal nystagmus noted.  Workup reveals unremarkable laboratory studies and CT scan of the head is unremarkable. She was given meclizine with significant improvement in her symptoms. This sounds like a peripheral vertigo. She will be discharged with meclizine and when necessary return.   EKG Interpretation   Date/Time:  Monday December 05 2013 11:20:06 EDT Ventricular Rate:  64 PR Interval:  188 QRS Duration: 86 QT Interval:  432 QTC Calculation: 445 R Axis:   -7 Text Interpretation:  Normal sinus rhythm Left ventricular hypertrophy  with repolarization abnormality Abnormal ECG Confirmed by Malva CoganELOS  MD,  Argie Lober ([id number]) on 12/05/2013 12:17:49 PM       Geoffery Lyonsouglas Gaynor Ferreras, MD 12/06/13 [phone]

## 2015-05-04 ENCOUNTER — Emergency Department (HOSPITAL_BASED_OUTPATIENT_CLINIC_OR_DEPARTMENT_OTHER)
Admission: EM | Admit: 2015-05-04 | Discharge: 2015-05-04 | Disposition: A | Discharge status: Home / Self Care | Payer: Medicare Other | Attending: Emergency Medicine | Admitting: Emergency Medicine

## 2015-05-04 ENCOUNTER — Encounter (HOSPITAL_BASED_OUTPATIENT_CLINIC_OR_DEPARTMENT_OTHER): Payer: Self-pay | Admitting: *Deleted

## 2015-05-04 DIAGNOSIS — E785 Hyperlipidemia, unspecified: Secondary | ICD-10-CM | POA: Diagnosis not present

## 2015-05-04 DIAGNOSIS — N390 Urinary tract infection, site not specified: Secondary | ICD-10-CM | POA: Diagnosis not present

## 2015-05-04 DIAGNOSIS — Z792 Long term (current) use of antibiotics: Secondary | ICD-10-CM | POA: Diagnosis not present

## 2015-05-04 DIAGNOSIS — K5900 Constipation, unspecified: Secondary | ICD-10-CM | POA: Insufficient documentation

## 2015-05-04 DIAGNOSIS — R1011 Right upper quadrant pain: Secondary | ICD-10-CM

## 2015-05-04 DIAGNOSIS — Z7984 Long term (current) use of oral hypoglycemic drugs: Secondary | ICD-10-CM | POA: Diagnosis not present

## 2015-05-04 DIAGNOSIS — Z9889 Other specified postprocedural states: Secondary | ICD-10-CM | POA: Diagnosis not present

## 2015-05-04 DIAGNOSIS — Z79899 Other long term (current) drug therapy: Secondary | ICD-10-CM | POA: Insufficient documentation

## 2015-05-04 DIAGNOSIS — I10 Essential (primary) hypertension: Secondary | ICD-10-CM | POA: Insufficient documentation

## 2015-05-04 DIAGNOSIS — E119 Type 2 diabetes mellitus without complications: Secondary | ICD-10-CM | POA: Insufficient documentation

## 2015-05-04 LAB — CBC WITH DIFFERENTIAL/PLATELET
BASOS ABS: 0 10*3/uL (ref 0.0–0.1)
BASOS PCT: 0 %
Eosinophils Absolute: 0.2 10*3/uL (ref 0.0–0.7)
Eosinophils Relative: 2 %
HEMATOCRIT: 45.8 % (ref 36.0–46.0)
HEMOGLOBIN: 14.8 g/dL (ref 12.0–15.0)
Lymphocytes Relative: 33 %
Lymphs Abs: 2.8 10*3/uL (ref 0.7–4.0)
MCH: 28.8 pg (ref 26.0–34.0)
MCHC: 32.3 g/dL (ref 30.0–36.0)
MCV: 89.3 fL (ref 78.0–100.0)
Monocytes Absolute: 0.6 10*3/uL (ref 0.1–1.0)
Monocytes Relative: 7 %
NEUTROS ABS: 5 10*3/uL (ref 1.7–7.7)
NEUTROS PCT: 58 %
Platelets: 277 10*3/uL (ref 150–400)
RBC: 5.13 MIL/uL — ABNORMAL HIGH (ref 3.87–5.11)
RDW: 12.6 % (ref 11.5–15.5)
WBC: 8.6 10*3/uL (ref 4.0–10.5)

## 2015-05-04 LAB — COMPREHENSIVE METABOLIC PANEL
ALK PHOS: 75 U/L (ref 38–126)
ALT: 18 U/L (ref 14–54)
AST: 27 U/L (ref 15–41)
Albumin: 3.7 g/dL (ref 3.5–5.0)
Anion gap: 5 (ref 5–15)
BILIRUBIN TOTAL: 0.6 mg/dL (ref 0.3–1.2)
BUN: 16 mg/dL (ref 6–20)
CO2: 27 mmol/L (ref 22–32)
Calcium: 8.6 mg/dL — ABNORMAL LOW (ref 8.9–10.3)
Chloride: 105 mmol/L (ref 101–111)
Creatinine, Ser: 0.81 mg/dL (ref 0.44–1.00)
GFR calc Af Amer: >60 mL/min (ref >60)
GFR calc non Af Amer: >60 mL/min (ref >60)
GLUCOSE: 170 mg/dL — AB (ref 65–99)
Potassium: 4.2 mmol/L (ref 3.5–5.1)
Sodium: 137 mmol/L (ref 135–145)
TOTAL PROTEIN: 7.6 g/dL (ref 6.5–8.1)

## 2015-05-04 LAB — URINALYSIS, ROUTINE W REFLEX MICROSCOPIC
Bilirubin Urine: NEGATIVE
GLUCOSE, UA: NEGATIVE mg/dL
HGB URINE DIPSTICK: NEGATIVE
Ketones, ur: NEGATIVE mg/dL
Nitrite: NEGATIVE
PROTEIN: 30 mg/dL — AB
Specific Gravity, Urine: 1.021 (ref 1.005–1.030)
pH: 6 (ref 5.0–8.0)

## 2015-05-04 LAB — URINE MICROSCOPIC-ADD ON: RBC / HPF: NONE SEEN RBC/hpf (ref 0–5)

## 2015-05-04 LAB — LIPASE, BLOOD: Lipase: 19 U/L (ref 11–51)

## 2015-05-04 MED ORDER — POLYETHYLENE GLYCOL 3350 17 GM/SCOOP PO POWD: 1.0000 | Freq: Once | ORAL | Status: DC

## 2015-05-04 MED ORDER — CEPHALEXIN 500 MG PO CAPS: 500.0000 mg | ORAL_CAPSULE | Freq: Two times a day (BID) | ORAL | Status: DC

## 2015-05-04 NOTE — ED Notes (Signed)
[facility], CT abdomen done at that time with no identifiable source of pain, per patient's daughter. Now pt presents with RUQ pain, last bowel movement over a week ago

## 2015-05-04 NOTE — ED Provider Notes (Signed)
CSN: [id number]     Arrival date & time 05/04/15  1145 History   First MD Initiated Contact with Patient 05/04/15 1157     Chief Complaint  Patient presents with  . Abdominal Pain   [place] interpreter used for interview and exam  HPI  Stacy Reid is a 81 year old female with a past medical history of diabetes, hypertension and hyperlipidemia presenting with abdominal pain. She reports onset of abdominal pain approximately 3 days ago. The pain is sharp in the right upper quadrant. The pain radiates around to the right flank. The pain has been constant though occasionally worsens in intensity. Denies associated symptoms of fevers, chills, nausea, vomiting, diarrhea, dysuria or hematuria. She does endorse constipation and states that she has not had a bowel movement in 4-5 days. She is still passing gas. Her daughter is at bedside and reports that they went to [facility] recently for the same complaint except in the left side. She states that they had a CT of the abdomen done at this time (12/1) which showed no acute disease processes. She was instructed to take colace and Miralax but Stacy Reid states that she is not taking these medications.   Past Medical History  Diagnosis Date  . Diabetes mellitus without complication (HCC)   . Hypertension   . Hyperlipidemia    Past Surgical History  Procedure Laterality Date  . Knee surgery    . Eye surgery    . Cardiac catheterization    . Back surgery     No family history on file. Social History  Substance Use Topics  . Smoking status: Never Smoker   . Smokeless tobacco: None  . Alcohol Use: No   OB History    No data available     Review of Systems  Constitutional: Negative for fever and chills.  Respiratory: Negative for shortness of breath.   Cardiovascular: Negative for chest pain.  Gastrointestinal: Positive for abdominal pain and constipation. Negative for nausea, vomiting and diarrhea.  Genitourinary: Negative for dysuria  and hematuria.  Neurological: Negative for dizziness, syncope and headaches.  All other systems reviewed and are negative.     Allergies  Review of patient's allergies indicates no known allergies.  Home Medications   Prior to Admission medications   Medication Sig Start Date End Date Taking? Authorizing Provider  amoxicillin (AMOXIL) 500 MG capsule Take 2 capsules (1,000 mg total) by mouth 2 (two) times daily. 11/29/13   Jerelyn Scott, MD  azithromycin (ZITHROMAX) 250 MG tablet Take 1 tablet (250 mg total) by mouth daily. Take 1 tab po every day until finished. 11/29/13   Jerelyn Scott, MD  cephALEXin (KEFLEX) 500 MG capsule Take 1 capsule (500 mg total) by mouth 2 (two) times daily. 05/04/15   Ryleigh Esqueda, PA-C  losartan (COZAAR) 50 MG tablet Take 50 mg by mouth daily.    Historical Provider, MD  meclizine (ANTIVERT) 25 MG tablet Take 1 tablet (25 mg total) by mouth 3 (three) times daily as needed for dizziness. 12/05/13   Elpidio Anis, PA-C  metFORMIN (GLUCOPHAGE) 1000 MG tablet Take 1,000 mg by mouth 2 (two) times daily with a meal.    Historical Provider, MD  ondansetron (ZOFRAN ODT) 8 MG disintegrating tablet Take 1 tablet (8 mg total) by mouth every 8 (eight) hours as needed for nausea or vomiting. 12/05/13   Elpidio Anis, PA-C  polyethylene glycol powder (GLYCOLAX/MIRALAX) powder Take 255 g by mouth once. 05/04/15   Alveta Heimlich, PA-C  pravastatin (PRAVACHOL) 40 MG tablet Take 40 mg by mouth daily.    Historical Provider, MD   BP 174/91 mmHg  Pulse 70  Temp(Src) 98 F (36.7 C) (Oral)  Resp 16  Ht 5\' 3"  (1.6 m)  Wt 68.04 kg  BMI 26.58 kg/m2  SpO2 100% Physical Exam  Constitutional: She appears well-developed and well-nourished. No distress.  Uncomfortable appearing  HENT:  Head: Normocephalic and atraumatic.  Eyes: Conjunctivae are normal. Right eye exhibits no discharge. Left eye exhibits no discharge. No scleral icterus.  Neck: Normal range of motion.  Cardiovascular:  Normal rate and regular rhythm.   Pulmonary/Chest: Effort normal. No respiratory distress.  Abdominal: Soft. There is tenderness in the right upper quadrant. There is no rebound, no guarding, no CVA tenderness and negative Murphy's sign.    Abdomen is soft with mild tenderness in the right upper quadrant as depicted in the diagram. No peritoneal signs  Musculoskeletal: Normal range of motion.  Neurological: She is alert. Coordination normal.  Skin: Skin is warm and dry.  Psychiatric: She has a normal mood and affect. Her behavior is normal.  Nursing note and vitals reviewed.   ED Course  Procedures (including critical care time) Labs Review Labs Reviewed  URINALYSIS, ROUTINE W REFLEX MICROSCOPIC (NOT AT [facility]) - Abnormal; Notable for the following:    Color, Urine AMBER (*)    APPearance CLOUDY (*)    Protein, ur 30 (*)    Leukocytes, UA TRACE (*)    All other components within normal limits  URINE MICROSCOPIC-ADD ON - Abnormal; Notable for the following:    Squamous Epithelial / LPF 6-30 (*)    Bacteria, UA MANY (*)    All other components within normal limits  CBC WITH DIFFERENTIAL/PLATELET - Abnormal; Notable for the following:    RBC 5.13 (*)    All other components within normal limits  COMPREHENSIVE METABOLIC PANEL - Abnormal; Notable for the following:    Glucose, Bld 170 (*)    Calcium 8.6 (*)    All other components within normal limits  LIPASE, BLOOD    Imaging Review No results found. I have personally reviewed and evaluated these images and lab results as part of my medical decision-making.   EKG Interpretation None      MDM   Final diagnoses:  Constipation, unspecified constipation type  Right upper quadrant pain  UTI (lower urinary tract infection)   81 year old female presenting with right upper quadrant abdominal pain 3 days. Patient also reports constipation times 4-5 days. No other associated symptoms. She was seen at [facility] by her  PCP for the same complaints at the beginning of the month. She had an abdominal CT at this time which was negative. Her PCP recommended Colace and MiraLAX which Stacy Reid reports she is not taking. VSS. Patient is nontoxic, nonseptic appearing, in no apparent distress. Abdomen is soft with mild tenderness in RUQ. No peritoneal signs suggesting surgical abdomen. Labs, imaging and vitals reviewed. Lab work is unremarkable. Urinalysis suggestive of UTI. Noncompliance with constipation medications likely source of their symptoms. Patient does not meet the SIRS or Sepsis criteria. We will also treat UTI as this may be a component of the abdominal pain. Patient discharged home with MiraLAX and Keflex and given strict instructions for follow-up with their primary care physician. Discussed the importance of medication compliance. Patient discussed with Dr. Clydene PughKnott who agrees with this plan. I have also discussed reasons to return immediately to the ER.  Patient expresses understanding and agrees with plan.     Alveta Heimlich, PA-C 05/04/15 1405  Lyndal Pulley, MD 05/04/15 [phone]

## 2015-05-04 NOTE — Discharge Instructions (Signed)
TAKE 6 CAPFULS OF MIRALAX IN A 32 OUNCE GATORADE AND DRINK THE WHOLE BEVERAGE FOLLOWED BY 3 CAPFULS TWICE A DAY FOR THE NEXT WEEK AND FOLLOW UP WITH YOUR PRIMARY CARE PHYSICIAN.   To bn, Ng??i l?n (Constipation, Adult) To bn l khi m?t ng??i ?i ??i ti?n t h?n ba l?n trong m?t tu?n, ??i ti?n kh kh?n, ho?c ??i ti?n ra phn kh, c?ng, ho?c to h?n bnh th??ng. Khi tu?i cng cao th cng d? b? to bn. Ch? ?? ?n thi?u ch?t x?, khng u?ng ?? n??c, v dng m?t s? lo?i thu?c nh?t ??nh c th? lm to bo n?ng thm.  NGUYN NHN.   M?t s? lo?i thu?c nh?t ??nh nh? thu?c ch?ng tr?m c?m, thu?c gi?m ?au, thu?c c b? sung s?t, thu?c trung ha axit d?ch v?, v thu?c l?i ti?u.  M?t s? b?nh l nh?t ??nh nh? ti?u ???ng, h?i ch?ng ru?t kch thch (IBS), b?nh c?a tuy?n gip, ho?c tr?m c?m.  Khng u?ng ?? n??c.  Khng ?n ?? th?c ?n giu ch?t x?.  C?ng th?ng ho?c do ?i l?i.  t ho?t ??ng thn th? ho?c th? d?c.  Nh?n ?i ??i ti?n.  S? d?ng qu nhi?u thu?c nhu?n trng. D?U HI?U V TRI?U CH?NG   ?i ??i ti?n t h?n ba l?n m?i tu?n.  Ph?i r?n m?nh ?? ??i ti?n.  ??i ti?n ra phn c?ng, kh, ho?c to h?n bnh th??ng.  C?m th?y ??y b?ng ho?c ch??ng b?ng.  ?au ? vng b?ng d??i.  Khng c?m th?y tho?i mi sau khi ??i ti?n. CH?N ?ON  Chuyn gia ch?m [place] s?c kh?e c?a qu v? s? h?i v? b?nh s? v khm th?c th? cho qu v?. C th? c?n ki?m tra thm trong tr??ng h?p to bn n?ng. M?t s? ki?m tra c th? bao g?m:  Ch?p X quang c ch?t c?n quang ?? ki?m tra tr?c trng, ??i trng, v ?i khi c? ru?t non c?a qu v?.  N?i soi tr?c trng sigma ?? ki?m tra ph?n pha d??i c?a ??i trng.  Th? thu?t soi ??i trng ?? khm ton b? ??i trng. ?I?U TR?  ?i?u tr? ty thu?c vo m?c ?? tr?m tr?ng c?a ch?ng to bn v nguyn nhn gy to bn. ?i?u tr? thng qua ch? ?? ?n u?ng bao g?m u?ng nhi?u n??c h?n v ?n th?c ?n c nhi?u ch?t x? h?n. ?i?u tr? thng qua l?i s?ng c th? bao g?m vi?c t?p th? d?c th??ng xuyn. N?u nh?ng  khuy?n ngh? v? ch? ?? ?n v l?i s?ng khng c tc d?ng, chuyn gia ch?m [place] s?c kh?e c th? khuy?n ngh? qu v? dng cc lo?i thu?c nhu?n trng khng c?n k ??n ?? gip qu v? ??i ti?n. C th? ph?i k ??n thu?c c?n k ??n n?u thu?c khng c?n k ??n khng c tc d?ng.  H??NG D?N CH?M [place] T?I NH   ?n th?c ?n c nhi?u ch?t x? nh? tri cy, rau, ng? c?c nguyn h?t, v cc lo?i ??u.  H?n ch? th?c ?n c nhi?u ch?t bo v ???ng ch? bi?n s?n, ch?ng h?n khoai ty chin, bnh hamburger, bnh quy, k?o, v soda.  C th? dng th?c ph?m ch?c n?ng c b? sung ch?t x? vo kh?u ph?n ?n c?a qu v? n?u qu v? khng th? dng ?? ch?t x? t? th?c ?n.  U?ng ?? n??c ?? gi? cho n??c ti?u trong ho?c vng nh?t.  T?p th? d?c th??ng xuyn ho?c theo ch? d?n c?a chuyn gia ch?m  [place] s?c kh?e.  Vo nh v? sinh ngay khi qu v? c nhu c?u. Khng nh?n ?i ??i ti?n.  Ch? s? d?ng thu?c khng c?n k ??n ho?c thu?c c?n k ??n theo ch? d?n c?a chuyn gia ch?m [place] s?c kh?e.Khng dng cc lo?i thu?c ch?ng to bn khc m khng bn b?c tr??c v?i chuyn gia ch?m [place] s?c kh?e. NGAY L?P T?C ?I KHM N?U:   ?i ??i ti?n ra mu ?? t??i.  Ch?ng to bn ko di h?n 4 ngy v tr?m tr?ng h?n.  Qu v? b? ?au b?ng ho?c ?au tr?c trng.  Phn c?a qu v? m?ng, trng nh? bt ch.  Qu v? b? s?t cn khng r nguyn nhn. ??M B?O QU V?:   Hi?u r cc h??ng d?n ny.  S? theo di tnh tr?ng c?a mnh.  S? yu c?u tr? gip ngay l?p t?c n?u qu v? c?m th?y khng kh?e ho?c th?y tr?m tr?ng h?n.   Thng tin ny khng nh?m m?c ?ch thay th? cho l?i khuyn m chuyn gia ch?m [place] s?c kh?e ni v?i qu v?. Hy b?o ??m qu v? ph?i th?o lu?n b?t k? v?n ?? g m qu v? c v?i chuyn gia ch?m [place] s?c kh?e c?a qu v?.   Document Released: 08/06/2010 Document Revised: 05/12/2014 Elsevier Interactive Patient Education 2016 Else[employer] Inc.  ?au b?ng, Ng??i l?n (Abdominal Pain, Adult) C nhi?u nguyn nhn d?n ??n ?au b?ng. Thng th??ng ?au b?ng l do m?t b?nh  gy ra v s? khng ?? n?u khng ?i?u tr?[place] B?nh ny c th? ???c theo di v ?i?u tr? t?i nh. Chuyn gia ch?m [place] s?c kh?e s? ti?n hnh khm th?c th? v c th? yu c?u lm xt nghi?m mu v ch?p X quang ?? xc ??nh m?c ?? nghim tr?ng c?a c?n ?au b?ng. Tuy nhin, trong nhi?u tr??ng h?p, ph?i m?t nhi?u th?i gian h?n ?? xc ??nh r nguyn nhn gy ?au b?ng. Tr??c khi tm ra nguyn nhn, chuyn gia ch?m [place] s?c kh?e c th? khng bi?t li?u qu v? c c?n lm thm xt nghi?m ho?c ti?p t?c ?i?u tr? hay khng. H??NG D?N CH?M [place] T?I NH  Theo di c?n ?au b?ng xem c b?t k? thay ??i no khng. Nh?ng hnh ??ng sau c th? gip lo?i b? b?t c? c?m gic kh ch?u no qu v? ?ang b?.  Ch? s? d?ng thu?c khng c?n k ??n ho?c thu?c c?n k ??n theo ch? d?n c?a chuyn gia ch?m [place] s?c kh?e.  Khng dng thu?c nhu?n trng tr? khi ???c chuyn gia ch?m  s?c kh?e ch? ??nh.  Th? dng m?t ch? ?? ?n l?ng (n??c lu?c th?t, tr, ho?c n??c) theo ch? d?n c?a chuyn gia ch?m  s?c kh?e. Chuy?n d?n sang m?t ch? ?? ?n nh? n?u ch?u ???c. ?I KHM N?U:  Qu v? b? ?au b?ng khng r nguyn nhn.  Qu v? b? ?au b?ng km theo bu?n nn ho?c tiu ch?y.  Qu v? b? ?au khi ?i ti?u ho?c ?i ngoi.  Qu v? b? c?n ?au b?ng lm th?c gi?c vo ban ?m.  Qu v? b? ?au b?ng n?ng thm ho?c ?? h?n khi ?n.  Qu v? b? ?au b?ng n?ng thm khi ?n ?? ?n nhi?u ch?t bo.  Qu v? b? s?t. NGAY L?P T?C ?I KHM N?U:   C?n ?au khng kh?i trong vng 2 gi?[place] Heads v? v?n ti?p t?c b? i (nn m?a).  Ch? c th? c?m th?y ?au ? m?t  s? ph?n b?ng, ch?ng h?n nh? ? ph?n b?ng bn ph?i ho?c bn d??i tri.  Phn c?a qu v? c mu ho?c c mu ?en nh? h?c n. ??M B?O QU V?:  Hi?u r cc h??ng d?n ny.  S? theo di tnh tr?ng c?a mnh.  S? yu c?u tr? gip ngay l?p t?c n?u qu v? c?m th?y khng kh?e ho?c th?y tr?m tr?ng h?n.   Thng tin ny khng nh?m m?c ?ch thay th? cho l?i khuyn m chuyn gia ch?m [place] s?c kh?e ni v?i qu v?. Hy b?o ??m qu v? ph?i th?o  lu?n b?t k? v?n ?? g m qu v? c v?i chuyn gia ch?m [place] s?c kh?e c?a qu v?.   Document Released: 04/21/2005 Document Revised: 05/12/2014 Elsevier Interactive Patient Education 2016 [employer]  Nhi?m Trng ???ng Ti?t Ni?u (Urinary Tract Infection) Nhi?m trng ???ng ti?t ni?u (UTI) c th? pht tri?n ?? b?t c? vi? tri? na?o d?c ???ng ti?t ni?u. ???ng ti?t ni?u l h? th?ng thot n??c c?a c? th? ?? lo?i b? ch?t th?i v n??c d? th?a. ???ng ti?t ni?u bao g?m hai th?n, ni?u qu?n, bng quang v ni?u ??o. Th?n l m?t c?p c? quan hnh h?t ??u. M?i th?n co? kch th??c kho?ng b?ng bn tay c?a b?n. Chng n?m d??i x??ng s??n, m?i bn c?t s?ng c m?t qu?[place].  NGUYN NHN Nhi?m trng gy b?i vi sinh v?t, l cc sinh v?t nh?, bao g?m n?m, vi rt v vi khu?n. Nh?ng sinh v?t ny nh? t?i m?c chng ch? c th? ???c nhn th?y qua knh hi?n vi. Vi khu?n l nh?ng vi sinh v?t ph? bi?n nh?t gy ra UTI. TRI?U CH?NG Cc tri?u ch?ng c?a UTI c th? khc nhau, ty theo ?? tu?i v gi?i tnh c?a b?nh nhn v b?i v? tr nhi?m trng. Cc tri?u ch?ng ? ph? n? tr? th??ng bao g?m nhu c?u ti?u ti?n th??ng xuyn v d? d?i, c?m gic ?au v rt ? bng quang ho?c ni?u ??o khi ?i ti?u. Ph? n? v nam gi?i l?n tu?i c nhi?u kh? n?ng b? m?t m?i, run r?y v y?u, ??ng th?i b? ?au c? v ?au b?ng. S?t c th? c ngh?a l nhi?m trng ? th?n. Cc tri?u ch?ng khc c?a nhi?m trng th?n bao g?m ?au ? l?ng ho?c hai bn d??i x??ng s??n, bu?n nn v nn m?a. CH?N ?ON ?? ch?n ?on UTI, chuyn gia ch?m [place] s?c kh?e s? h?i b?n v? nh?ng tri?u ch?ng c?a b?n. Chuyn gia ch?m [place] s?c kh?e c?ng s? yu c?u cung c?p m?u n??c ti?u. M?u n??c ti?u s? ???c xt nghi?m xem c vi khu?n v cc t? bo mu tr?ng khng. Cc t? bo mu tr?ng ???c t?o b?i c? th? ?? gip ch?ng l?i nhi?m trng. ?I?U TR? Thng th??ng, UTI c th? ???c ?i?u tr? b?ng thu?c. B?i v h?u h?t nguyn nhn gy ra UTI l do nhi?m trng b?i vi khu?n, chng th??ng c th? ???c ?i?u tr? b?ng cch s? d?ng  thu?c khng sinh. Vi?c l?a ch?n khng sinh v th?i gian ?i?u tr? ph? thu?c vo tri?u ch?ng v lo?i vi khu?n gy nhi?m trng. H??NG D?N CH?M [place] T?I NH  N?u b?n ? ???c k khng sinh, hy s? d?ng chng ?ng nh? chuyn gia ch?m [place] s?c kh?e h??ng d?n. S? d?ng h?t thu?c ngay c? khi b?n c?m th?y kh h?n sau khi ch? dng m?t ph?n thu?c.  U?ng ?? n??c v dung d?ch ?? n??c ti?u trong ho?c  c mu vng nh?t.  Trnh caffeine, tr v ?? u?ng c ga. Chng c xu h??ng kch thch bng quang.  ?i ti?u th??ng xuyn. Trnh nh?n ti?u trong th?i gian di.  ?i ti?u tr??c v sau khi quan h? tnh d?c.  Sau khi ?i ??i ti?n, ph? n? c?n lm s?ch t? tr??c ra sau. Ch? s? d?ng gi?y v? sinh m?t l?n. HY ?I KHM N?U:  B?n b? ?au l?ng.  B?n b? s?t.  Cc tri?u ch?ng c?a b?n khng b?t ??u ?? trong vng 3 ngy. HY NGAY L?P T?C ?I KHM N?U:  B?n b? ?au l?ng ho?c ?au b?ng d??i nghim tr?ng.  B?n b? ?n l?nh.  B?n b? bu?n nn ho?c nn m?a.  B?n lin t?c b? rt ho?c kh ch?u khi ?i ti?u. ??M B?O B?N:  Hi?u cc h??ng d?n ny.  S? theo di tnh tr?ng c?a mnh.  S? yu c?u tr? gip ngay l?p t?c n?u b?n c?m th?y khng ?? ho?c tnh tr?ng tr?m tr?ng h?n.   Thng tin ny khng nh?m m?c ?ch thay th? cho l?i khuyn m chuyn gia ch?m [place] s?c kh?e ni v?i qu v?. Hy b?o ??m qu v? ph?i th?o lu?n b?t k? v?n ?? g m qu v? c v?i chuyn gia ch?m [place] s?c kh?e c?a qu v?.   Document Released: 04/21/2005 Document Revised: 12/22/2012 Elsevier Interactive Patient Education [employer]

## 2015-05-04 NOTE — ED Notes (Signed)
Right upper quadrant pain x 3 days. Constipation.

## 2015-06-26 IMAGING — CT CT HEAD W/O CM
1 series · 16 of 28 positions shown, 20 images · non-contrast
Comparison: None.

CLINICAL DATA: Vertigo

EXAM:
CT HEAD WITHOUT CONTRAST
TECHNIQUE: Contiguous axial images were obtained from the base of the skull
through the vertex without intravenous contrast.

[Series 2: head 4.8 h37s · axial · 0.45mm/px · z∈[-72,+53]mm · 16 of 28 slices shown, 20 images]
[im 2/28  brain]
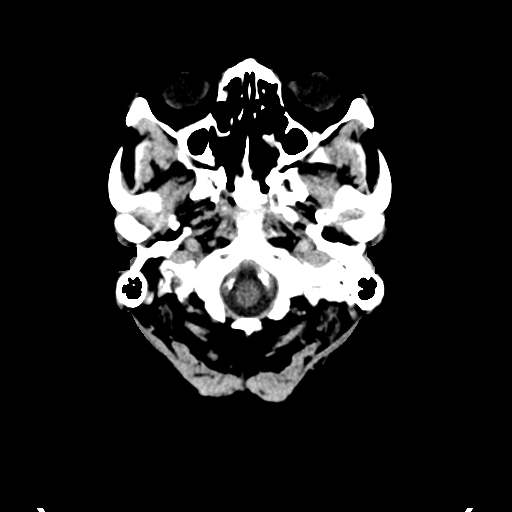
[im 2/28  bone]
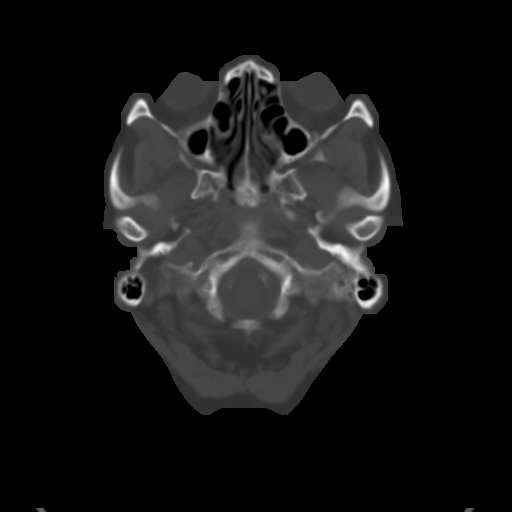
[im 4/28  brain]
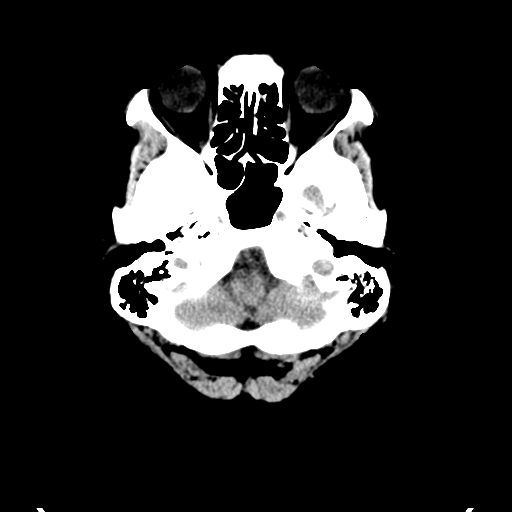
[im 6/28  brain]
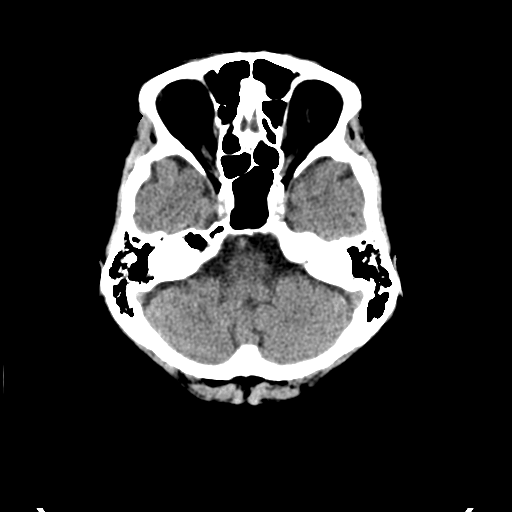
[im 7/28  brain]
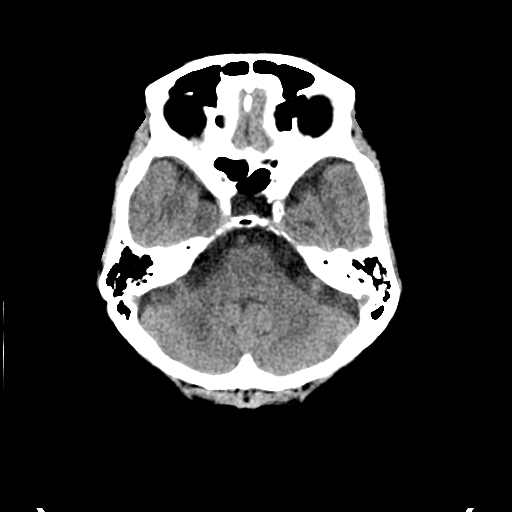
[im 9/28  brain]
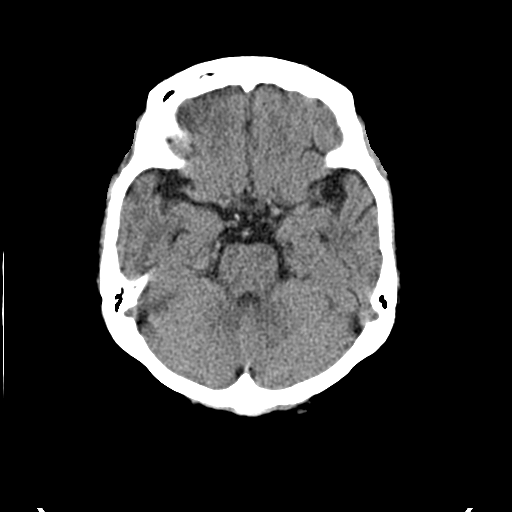
[im 9/28  bone]
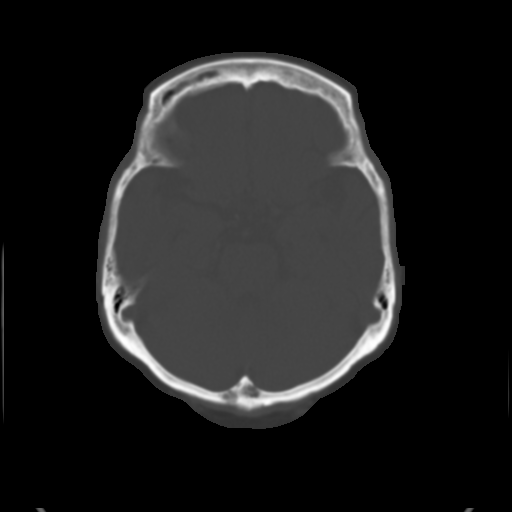
[im 10/28  brain]
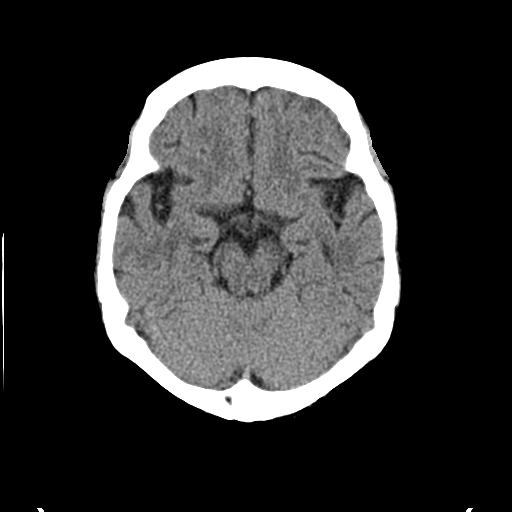
[im 12/28  brain]
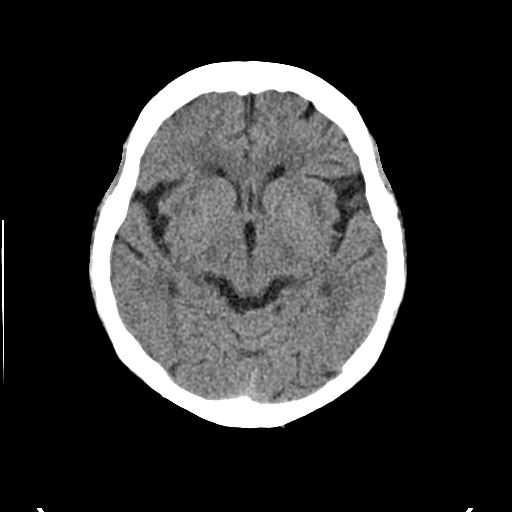
[im 14/28  brain]
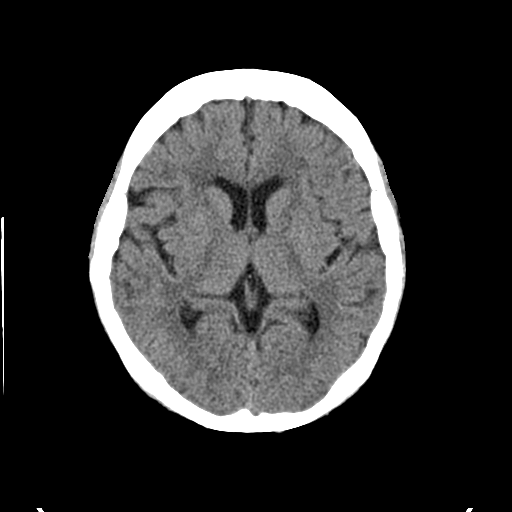
[im 15/28  brain]
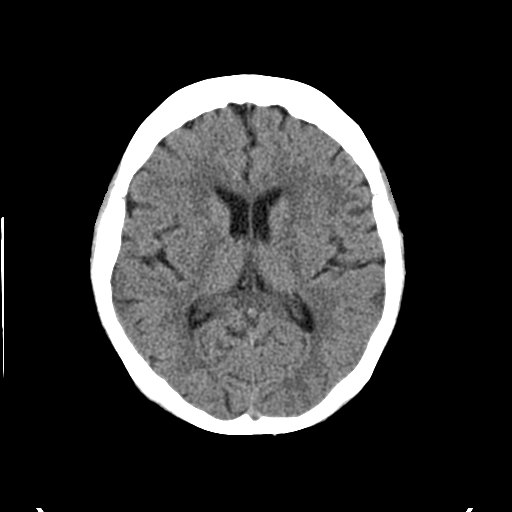
[im 15/28  bone]
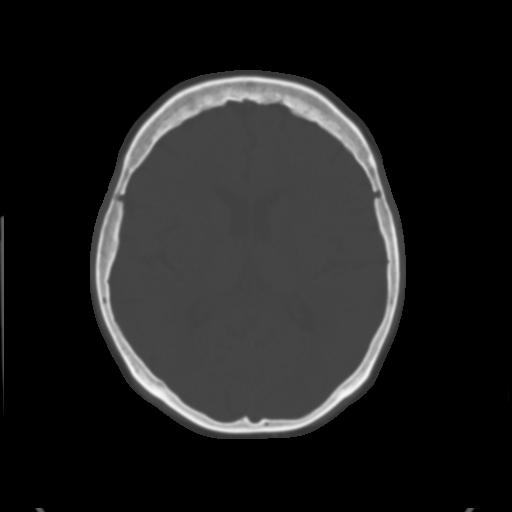
[im 17/28  brain]
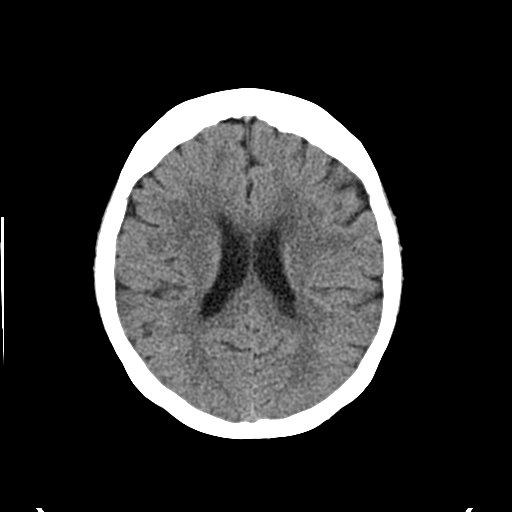
[im 19/28  brain]
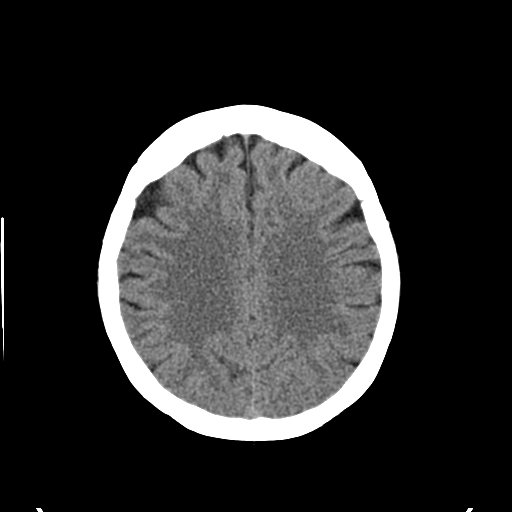
[im 20/28  brain]
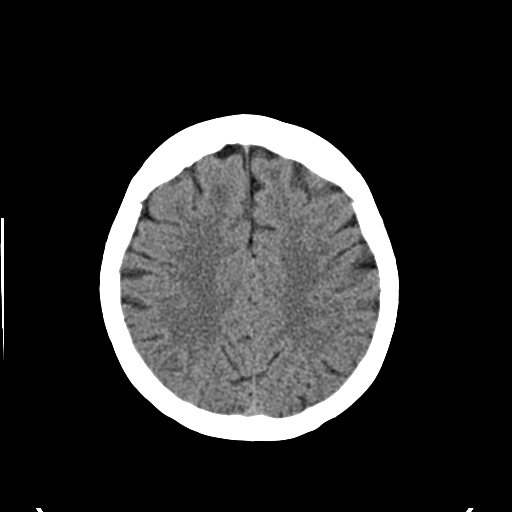
[im 22/28  brain]
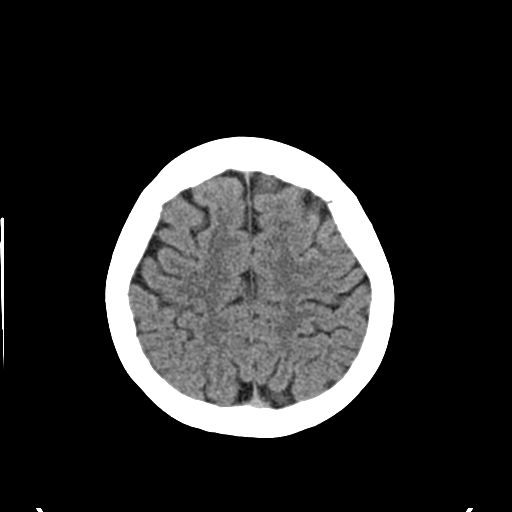
[im 22/28  bone]
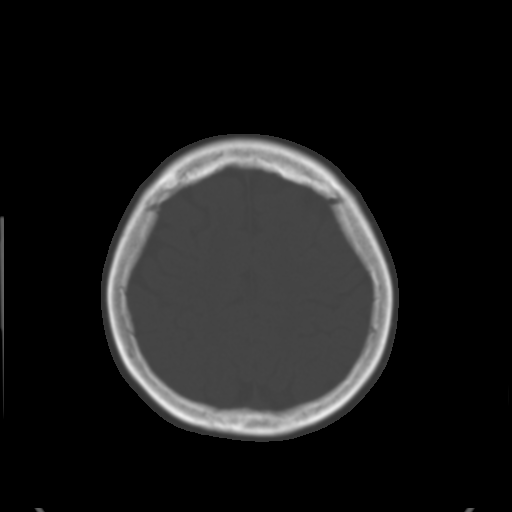
[im 23/28  brain]
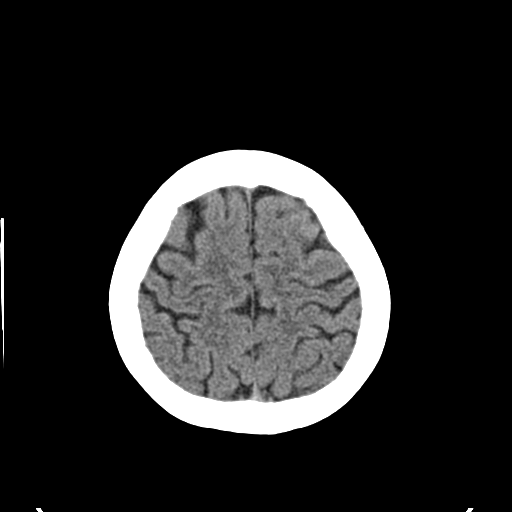
[im 25/28  brain]
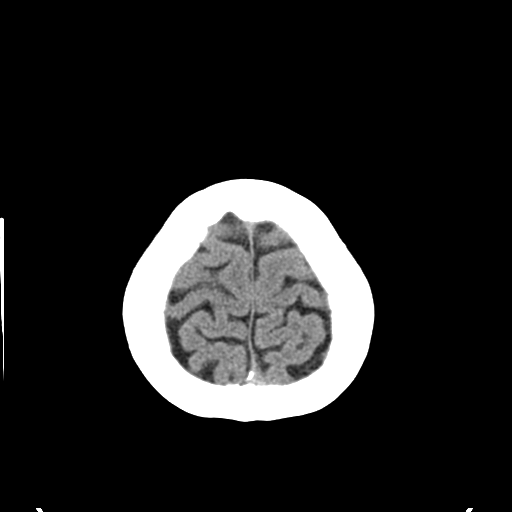
[im 27/28  brain]
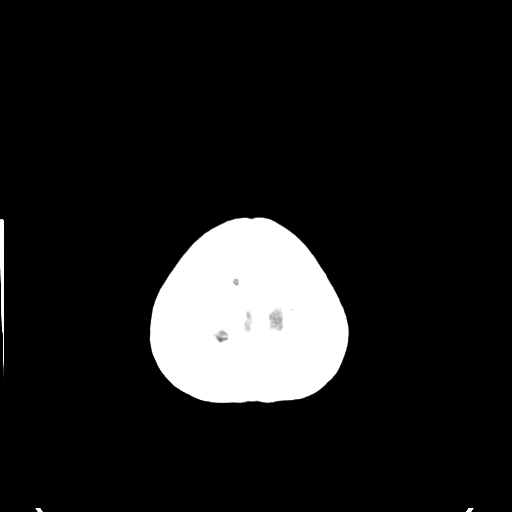

[16 of 28 positions shown; findings below may reference images not displayed]

FINDINGS: The bony calvarium is intact. No gross soft tissue abnormality is
seen. Mild atrophic changes are noted commensurate the patient's
given age. No findings to suggest acute hemorrhage, acute infarction
or space-occupying mass lesion are noted.
IMPRESSION: Chronic changes without acute abnormality.

## 2016-04-26 ENCOUNTER — Emergency Department (HOSPITAL_COMMUNITY)
Admission: EM | Admit: 2016-04-26 | Discharge: 2016-04-26 | Disposition: A | Discharge status: Home / Self Care | Payer: Medicare Other | Attending: Emergency Medicine | Admitting: Emergency Medicine

## 2016-04-26 ENCOUNTER — Encounter (HOSPITAL_COMMUNITY): Payer: Self-pay

## 2016-04-26 ENCOUNTER — Emergency Department (HOSPITAL_COMMUNITY): Payer: Medicare Other

## 2016-04-26 ENCOUNTER — Other Ambulatory Visit (HOSPITAL_COMMUNITY): Payer: Medicare Other

## 2016-04-26 DIAGNOSIS — I10 Essential (primary) hypertension: Secondary | ICD-10-CM | POA: Diagnosis not present

## 2016-04-26 DIAGNOSIS — R51 Headache: Secondary | ICD-10-CM | POA: Diagnosis not present

## 2016-04-26 DIAGNOSIS — M542 Cervicalgia: Secondary | ICD-10-CM | POA: Insufficient documentation

## 2016-04-26 DIAGNOSIS — E119 Type 2 diabetes mellitus without complications: Secondary | ICD-10-CM | POA: Diagnosis not present

## 2016-04-26 DIAGNOSIS — Z7984 Long term (current) use of oral hypoglycemic drugs: Secondary | ICD-10-CM | POA: Insufficient documentation

## 2016-04-26 DIAGNOSIS — R519 Headache, unspecified: Secondary | ICD-10-CM

## 2016-04-26 LAB — CBC WITH DIFFERENTIAL/PLATELET
Basophils Absolute: 0.1 10*3/uL (ref 0.0–0.1)
Basophils Relative: 0 %
EOS ABS: 0 10*3/uL (ref 0.0–0.7)
EOS PCT: 0 %
HCT: 45.7 % (ref 36.0–46.0)
Hemoglobin: 15.1 g/dL — ABNORMAL HIGH (ref 12.0–15.0)
LYMPHS ABS: 2.6 10*3/uL (ref 0.7–4.0)
LYMPHS PCT: 22 %
MCH: 29.4 pg (ref 26.0–34.0)
MCHC: 33 g/dL (ref 30.0–36.0)
MCV: 88.9 fL (ref 78.0–100.0)
MONOS PCT: 9 %
Monocytes Absolute: 1.1 10*3/uL — ABNORMAL HIGH (ref 0.1–1.0)
Neutro Abs: 8.3 10*3/uL — ABNORMAL HIGH (ref 1.7–7.7)
Neutrophils Relative %: 69 %
PLATELETS: 249 10*3/uL (ref 150–400)
RBC: 5.14 MIL/uL — ABNORMAL HIGH (ref 3.87–5.11)
RDW: 12.6 % (ref 11.5–15.5)
WBC: 12.1 10*3/uL — ABNORMAL HIGH (ref 4.0–10.5)

## 2016-04-26 LAB — COMPREHENSIVE METABOLIC PANEL
ALT: 9 U/L — ABNORMAL LOW (ref 14–54)
ANION GAP: 9 (ref 5–15)
AST: 15 U/L (ref 15–41)
Albumin: 3.8 g/dL (ref 3.5–5.0)
Alkaline Phosphatase: 67 U/L (ref 38–126)
BUN: 21 mg/dL — ABNORMAL HIGH (ref 6–20)
CALCIUM: 8.7 mg/dL — AB (ref 8.9–10.3)
CO2: 25 mmol/L (ref 22–32)
CREATININE: 0.8 mg/dL (ref 0.44–1.00)
Chloride: 102 mmol/L (ref 101–111)
Glucose, Bld: 124 mg/dL — ABNORMAL HIGH (ref 65–99)
Potassium: 3.9 mmol/L (ref 3.5–5.1)
SODIUM: 136 mmol/L (ref 135–145)
Total Bilirubin: 1 mg/dL (ref 0.3–1.2)
Total Protein: 7.4 g/dL (ref 6.5–8.1)

## 2016-04-26 MED ORDER — IBUPROFEN 200 MG PO TABS
600.0000 mg | ORAL_TABLET | Freq: Once | ORAL | Status: AC
  Administered 2016-04-26: 600 mg via ORAL
  Filled 2016-04-26: qty 3

## 2016-04-26 MED ORDER — IBUPROFEN 200 MG PO TABS
600.0000 mg | ORAL_TABLET | Freq: Once | ORAL | Status: DC
  Filled 2016-04-26: qty 3

## 2016-04-26 MED ORDER — MORPHINE SULFATE (PF) 4 MG/ML IV SOLN
4.0000 mg | Freq: Once | INTRAVENOUS | Status: DC
  Filled 2016-04-26: qty 1

## 2016-04-26 MED ORDER — PREDNISONE 20 MG PO TABS
60.0000 mg | ORAL_TABLET | Freq: Every day | ORAL | Status: DC
  Administered 2016-04-26: 60 mg via ORAL
  Filled 2016-04-26: qty 3

## 2016-04-26 MED ORDER — PREDNISONE 10 MG PO TABS: 20.0000 mg | ORAL_TABLET | Freq: Every day | ORAL | 0 refills | Status: AC

## 2016-04-26 MED ORDER — CYCLOBENZAPRINE HCL 10 MG PO TABS
5.0000 mg | ORAL_TABLET | Freq: Once | ORAL | Status: AC
  Administered 2016-04-26: 5 mg via ORAL
  Filled 2016-04-26: qty 1

## 2016-04-26 MED ORDER — DIAZEPAM 5 MG/ML IJ SOLN
5.0000 mg | Freq: Once | INTRAMUSCULAR | Status: DC
  Filled 2016-04-26: qty 2

## 2016-04-26 NOTE — ED Triage Notes (Signed)
Pt resting in bed with family at bedside, seen yesterday and aware she needs MRI.

## 2016-04-26 NOTE — ED Provider Notes (Signed)
[facility]-EMERGENCY DEPT Provider Note   CSN: [id number] Arrival date & time: 04/26/16  1202     History   Chief Complaint Chief Complaint  Patient presents with  . Neck Pain  . Headache    HPI Stacy Reid is a 82 y.o. female   Patient is an 82 yo female presents with new and worsening neck pain that started 2 days ago. Patient states the pain is constant and gradually worsening, throbbing, stabbing 10/10 pain. Patient's daughter reports her not being able to turn her head. Patient's daughter reports patient having trouble ambulating today due to pain and having blurry vision. Patient reports pain worsening with movement and muscle relaxers have not helped relieve pain. Patient's daughter denies any use of pain medications. Patient reports the neck pain started first and pain radiated to head causing headache and pain down shoulders. Patient's daughter reports going to her primary care physician yesterday and who said that it may be muscle spasm and given muscle relaxers. Patient's daughter says that today has been much worse and requesting an MRI that her primary care physician said would order after the holidays. Patient denies any fevers, chills, chest pain, shortness of breath, focal neurological deficits, nausea, vomiting, changes in urinary symptoms, changes in bowel movements, trauma, or any recent contacts.   The history is provided by the patient and a relative (Daughter). The history is limited by a language barrier. A language interpreter was used (Daughter - [place]  Neck Pain   Associated symptoms include photophobia and headaches. Pertinent negatives include no chest pain.  Headache   Pertinent negatives include no fever, no shortness of breath, no nausea and no vomiting.    Past Medical History:  Diagnosis Date  . Diabetes mellitus without complication (HCC)   . Hyperlipidemia   . Hypertension     There are no active problems to display for this patient.   Past  Surgical History:  Procedure Laterality Date  . BACK SURGERY    . CARDIAC CATHETERIZATION    . EYE SURGERY    . KNEE SURGERY      OB History    No data available       Home Medications    Prior to Admission medications   Medication Sig Start Date End Date Taking? Authorizing Provider  amoxicillin (AMOXIL) 500 MG capsule Take 2 capsules (1,000 mg total) by mouth 2 (two) times daily. 11/29/13   Jerelyn ScottMartha Linker, MD  azithromycin (ZITHROMAX) 250 MG tablet Take 1 tablet (250 mg total) by mouth daily. Take 1 tab po every day until finished. 11/29/13   Jerelyn ScottMartha Linker, MD  cephALEXin (KEFLEX) 500 MG capsule Take 1 capsule (500 mg total) by mouth 2 (two) times daily. 05/04/15   Stevi Barrett, PA-C  losartan (COZAAR) 50 MG tablet Take 50 mg by mouth daily.    Historical Provider, MD  meclizine (ANTIVERT) 25 MG tablet Take 1 tablet (25 mg total) by mouth 3 (three) times daily as needed for dizziness. 12/05/13   Elpidio AnisShari Upstill, PA-C  metFORMIN (GLUCOPHAGE) 1000 MG tablet Take 1,000 mg by mouth 2 (two) times daily with a meal.    Historical Provider, MD  ondansetron (ZOFRAN ODT) 8 MG disintegrating tablet Take 1 tablet (8 mg total) by mouth every 8 (eight) hours as needed for nausea or vomiting. 12/05/13   Elpidio AnisShari Upstill, PA-C  polyethylene glycol powder (GLYCOLAX/MIRALAX) powder Take 255 g by mouth once. 05/04/15   Stevi Barrett, PA-C  pravastatin (PRAVACHOL) 40 MG tablet Take  40 mg by mouth daily.    Historical Provider, MD  predniSONE (DELTASONE) 10 MG tablet Take 2 tablets (20 mg total) by mouth daily. 04/26/16 05/01/16  Alvina Chou, [place]    Family History History reviewed. No pertinent family history.  Social History Social History  Substance Use Topics  . Smoking status: Never Smoker  . Smokeless tobacco: Never Used  . Alcohol use No     Allergies   Patient has no known allergies.   Review of Systems Review of Systems  Constitutional: Negative for chills and fever.  HENT:  Negative for trouble swallowing.   Eyes: Positive for photophobia.       Blurry vision.  Respiratory: Negative for cough and shortness of breath.   Cardiovascular: Negative for chest pain.  Gastrointestinal: Negative for abdominal pain, nausea and vomiting.  Genitourinary: Negative for difficulty urinating and dysuria.  Musculoskeletal: Positive for gait problem, neck pain and neck stiffness.  Skin: Negative for color change, rash and wound.  Neurological: Positive for headaches. Negative for speech difficulty.     Physical Exam Updated Vital Signs BP (!) 161/104 (BP Location: Left Arm)   Pulse 68   Temp 98.3 F (36.8 C) (Oral)   Resp 15   Ht 5\' 2"  (1.575 m)   Wt 66.2 kg   SpO2 95%   BMI 26.70 kg/m   Physical Exam  Constitutional: She is oriented to person, place, and time. She appears well-developed and well-nourished.  Uncomfortable  HENT:  Head: Normocephalic and atraumatic.  Nose: Nose normal.  Mouth/Throat: Uvula is midline, oropharynx is clear and moist and mucous membranes are normal.  Tenderness to neck, shoulders, and back of head. No injuries or redness noted to the area.  Eyes: Conjunctivae and EOM are normal. Pupils are equal, round, and reactive to light.  Neck: Trachea normal. Spinous process tenderness and muscular tenderness present. No tracheal tenderness present. Neck rigidity present. Decreased range of motion present. No tracheal deviation, no edema and no erythema present. No Brudzinski's sign and no Kernig's sign noted.  Tenderness to neck and trapezius muscle. Pain on moving neck side to side.  Cardiovascular: Normal rate and normal heart sounds.   Pulmonary/Chest: Effort normal and breath sounds normal. No respiratory distress. She exhibits no tenderness.  Abdominal: Soft. There is no tenderness. There is no rebound and no guarding.  Musculoskeletal: She exhibits tenderness (Neck, trapezius muscle). She exhibits no edema or deformity.  Neurological:  She is alert and oriented to person, place, and time. No sensory deficit.  Cranial Nerves:  III,IV, VI: ptosis not present, extra-ocular movements intact bilaterally, direct and consensual pupillary light reflexes intact bilaterally V: facial sensation, jaw opening, and bite strength equal bilaterally VII: eyebrow raise, eyelid close, smile, frown, pucker equal bilaterally VIII: hearing grossly normal bilaterally  IX,X: palate elevation and swallowing intact XI: bilateral shoulder shrug and lateral head rotation equal and strong XII: midline tongue extension  Pronator drift negative. Finger-nose negative bilaterally. RAM negative.   Patient denied attempt to stand and ambulate due to pain.  Negative Kernig sign. Negative Brudzinski's sign.  Skin: Skin is warm. Capillary refill takes less than 2 seconds. No rash noted. No erythema.  Psychiatric: She has a normal mood and affect. Her behavior is normal.  Nursing note and vitals reviewed.   ED Treatments / Results  Labs (all labs ordered are listed, but only abnormal results are displayed) Labs Reviewed  CBC WITH DIFFERENTIAL/PLATELET - Abnormal; Notable for the following:  Result Value   WBC 12.1 (*)    RBC 5.14 (*)    Hemoglobin 15.1 (*)    Neutro Abs 8.3 (*)    Monocytes Absolute 1.1 (*)    All other components within normal limits  COMPREHENSIVE METABOLIC PANEL - Abnormal; Notable for the following:    Glucose, Bld 124 (*)    BUN 21 (*)    Calcium 8.7 (*)    ALT 9 (*)    All other components within normal limits    EKG  EKG Interpretation None       Radiology Mr Brain Wo Contrast  Result Date: 04/26/2016 CLINICAL DATA:  82 year old hypertensive diabetic female with neck pain and headache for 2 days. Awoke with blurred vision and un steady gait. No reported trauma. Subsequent encounter. EXAM: MRI HEAD WITHOUT CONTRAST TECHNIQUE: Multiplanar, multiecho pulse sequences of the brain and surrounding structures were  obtained without intravenous contrast. COMPARISON:  No comparison brain MR. Comparison head CT 12/05/2013. Cervical spine MR performed same date is dictated separately. FINDINGS: Brain: No acute infarct or intracranial hemorrhage. Moderate chronic microvascular changes. Mild global atrophy without hydrocephalus. No intracranial mass lesion noted on this unenhanced exam. Partially empty expanded sella without secondary findings of pseudotumor. Vascular: Major intracranial vascular structures are patent. Skull and upper cervical spine: No acute abnormality. Sinuses/Orbits: Post lens replacement without acute orbital abnormality. Mild mucosal thickening ethmoid sinus air cells and maxillary sinuses. Other: Negative IMPRESSION: No acute infarct or intracranial hemorrhage. Moderate chronic microvascular changes. Mild global atrophy without hydrocephalus. No intracranial mass lesion noted on this unenhanced exam. Partially empty expanded sella without secondary findings of pseudotumor. Mild mucosal thickening ethmoid sinus air cells and maxillary sinuses. Cervical spine MR performed same date is dictated separately. Electronically Signed   By: Lacy DuverneySteven  Olson M.D.   On: 04/26/2016 16:56   Mr Cervical Spine Wo Contrast  Result Date: 04/26/2016 CLINICAL DATA:  82 year old hypertensive diabetic female with neck pain and headache for 2 days. Awoke with blurred vision and un steady gait. No reported trauma. Subsequent encounter. EXAM: MRI CERVICAL SPINE WITHOUT CONTRAST TECHNIQUE: Multiplanar, multisequence MR imaging of the cervical spine was performed. No intravenous contrast was administered. COMPARISON:  04/25/2016 cervical spine plain film exam. 05/21/2009 cervical spine MR. Brain MR performed same date is dictated separately. FINDINGS: Alignment: Curvature cervical spine convex left. Vertebrae: No worrisome osseous abnormality. Cord: No focal cervical cord signal abnormality. Posterior Fossa, vertebral arteries,  paraspinal tissues: Cervicomedullary junction is within normal limits. No abnormal prevertebral soft tissue swelling. Vertebral arteries and carotid arteries appear patent. Mild transverse ligament hypertrophy. C2-3:  No spinal stenosis or foraminal narrowing. C3-4:  Minimal bulge.  Minimal narrowing ventral thecal sac. C4-5: Minimal bulge. Small left paracentral protrusion. Slight flattening left aspect of the cord. C5-6: Broad-based disc osteophyte complex. Mild narrowing ventral thecal sac without cord compression. Uncinate hypertrophy with mild moderate bilateral foraminal narrowing. C6-7: Shallow disc osteophyte complex greatest left paracentral position with slight narrowing left ventral thecal sac without cord compression. C7-T1:  No spinal stenosis or foraminal narrowing. IMPRESSION: Summary of pertinent findings includes: C3-4 minimal bulge.  Minimal narrowing ventral thecal sac. C4-5 minimal bulge. Small left paracentral protrusion. Slight flattening left aspect of the cord. C5-6 broad-based disc osteophyte complex. Mild narrowing ventral thecal sac without cord compression. Uncinate hypertrophy with mild to moderate bilateral foraminal narrowing. C6-7 shallow disc osteophyte complex greatest left paracentral position with slight narrowing left ventral thecal sac without cord compression. MR brain performed same date  is dictated separately. Electronically Signed   By: Lacy Duverney M.D.   On: 04/26/2016 16:47    Procedures Procedures (including critical care time)  Medications Ordered in ED Medications  predniSONE (DELTASONE) tablet 60 mg (60 mg Oral Given 04/26/16 1733)  ibuprofen (ADVIL,MOTRIN) tablet 600 mg (600 mg Oral Not Given 04/26/16 1928)  ibuprofen (ADVIL,MOTRIN) tablet 600 mg (600 mg Oral Given 04/26/16 1734)  cyclobenzaprine (FLEXERIL) tablet 5 mg (5 mg Oral Given 04/26/16 1928)     Initial Impression / Assessment and Plan / ED Course  I have reviewed the triage vital signs and  the nursing notes.  Pertinent labs & imaging results that were available during my care of the patient were reviewed by me and considered in my medical decision making (see chart for details).  Clinical Course   Patient is an 82 year old female presenting with neck pain and headache for the last 2 days. Patient had neck pain and stiff neck upon evaluation. Was seen by PCP yesterday diagnosed with arthritis however daughter states that today was much worse asking for MRI. On exam patient is uncomfortable, afebrile, vital signs stable. Heart and lung sounds are clear. Abdomen soft and nontender. Neuro exam negative. Sensory intact. Muscle strength 5/5 bilaterally. Negative Brudzinski sign. Negative Kernig sign. Patient denied attempts to stand and ambulate due to pain. MRI brain shows no acute infarct or intracranial hemorrhage. MRI of cervical spine shows C3-4 minimal bulge. Minimal narrowing ventral thecal sac. C4-5 minimal bulge. Small left paracentral protrusion. Slight flattening left aspect of the cord. Lab work consistent with previous findings. WBC's are slightly elevated, however patient is afebrile, stable, with no other complaints of cough, shortness of breath, chest pain, abdominal, nausea, vomiting, or urinary symptoms including dysuria, frequency, or urgency. Low suspicion for infectious process at this time. Patient given pain medication, steroids, and Flexeril in Ed. Patient and her daughter given strict instructions to follow-up with her primary care physician in 2-5 days and for better management of her blood pressure. Patient feels better and able to sit up with better movement of her head. Patient afebrile, hemodynamically stable. Patient has history of high blood pressure and told to follow-up with primary care physician for better management. Patient and her daughter asking for discharge. Return precautions given for any new or worsening symptoms such as fever, chills, chest pain, shortness  of breath, vomiting, confusion, altered mental status, any neurological deficits.  Dr. Silverio Lay also saw and evaluated patient.    Final Clinical Impressions(s) / ED Diagnoses   Final diagnoses:  Neck pain  Nonintractable headache, unspecified chronicity pattern, unspecified headache type    New Prescriptions Discharge Medication List as of 04/26/2016  6:21 PM    START taking these medications   Details  predniSONE (DELTASONE) 10 MG tablet Take 2 tablets (20 mg total) by mouth daily., Starting Sat 04/26/2016, Until Thu 05/01/2016, Print         [address] [place], [place] 04/26/16 2231    Charlynne Pander, MD 04/27/16 [phone]

## 2016-04-26 NOTE — Discharge Instructions (Signed)
Please schedule appointment with her primary care physician in 2-5 days if symptoms worsen. Please take prednisone as prescribed. Please take Motrin as needed for pain relief.  NGAY L?P T?C ?I KHM N?U: Qu v? ?au ??u n?ng h?n. Qu v? lin t?c b? nn m?a. Qu v? b? c?ng c?. Qu v? khng nhn th?y g. Qu v? b? kh ni. Qu v? b? ?au ? m?t ho?c tai. Qu v? b? y?u c? ho?c m?t ki?m sot c?. Qu v? m?t th?ng b?ng ho?c ?i l?i kh kh?n. Qu v? c?m th?y mu?n ng?t ho?c ng?t. Qu v? b? l l?n.

## 2016-04-26 NOTE — ED Notes (Signed)
Patient transported to MRI 

## 2016-04-26 NOTE — ED Triage Notes (Signed)
Pt c/o neck pain and throbbing headache x 2 days.  Pain score 9/10.  Pt was seen at PCP yesterday and diagnosed w/ arthritis.  Pt's daughter reports the Pt was started on muscle relaxers.  Sts "she took them all day yesterday and this morning."  Sts Pt woke up w/ "some blurred vision" and "is unsteady."  Facial symmetry and equal grips/pushes/pulls noted.  Daughter reports that PCP recommended a MRI, if Pt didn't start feeling better.  Sts she was unable to schedule a MRI today.

## 2016-04-26 NOTE — ED Triage Notes (Signed)
Attempted to start IV and give meds, pt still not in room

## 2017-11-15 IMAGING — MR MR CERVICAL SPINE W/O CM
4 of 6 series · 18 of 48 positions shown · non-contrast
Comparison: 04/25/2016 cervical spine plain film exam.

CLINICAL DATA: 82-year-old hypertensive diabetic female with neck
pain and headache for 2 days. Awoke with blurred vision and un
steady gait. No reported trauma. Subsequent encounter.

EXAM:
MRI CERVICAL SPINE WITHOUT CONTRAST
TECHNIQUE: Multiplanar, multisequence MR imaging of the cervical spine was
performed. No intravenous contrast was administered.

[Series 3: T1 · sagittal · 3.0mm · 0.41mm/px · 3 of 13 slices shown]
[im 1/13]
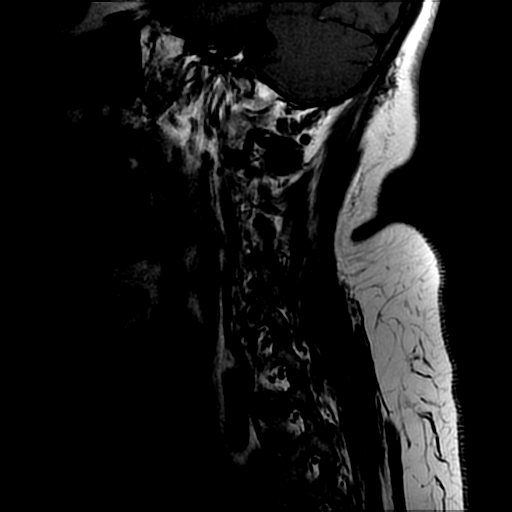
[im 7/13]
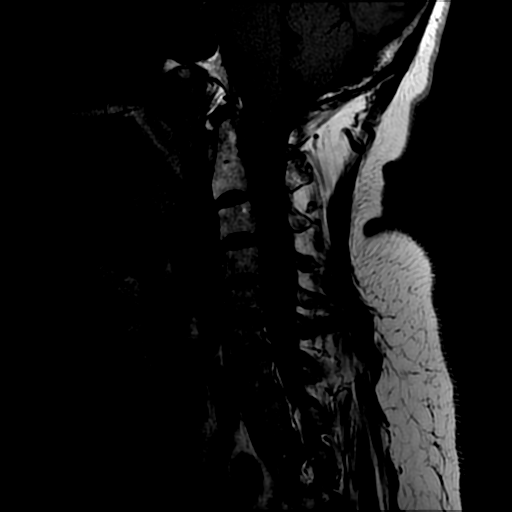
[im 13/13]
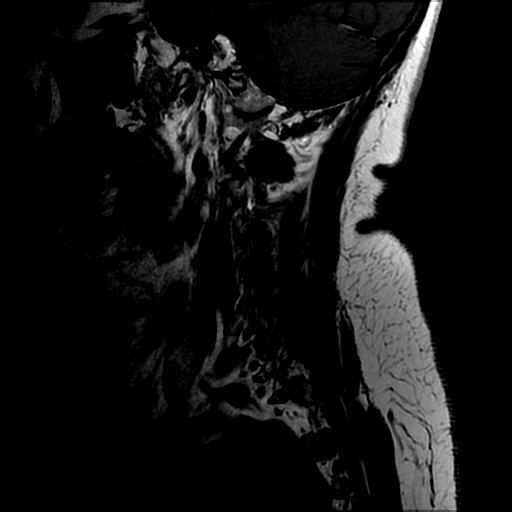

[Series 4: sag ir · sagittal · 3.0mm · 0.41mm/px · 3 of 13 slices shown]
[im 1/13]
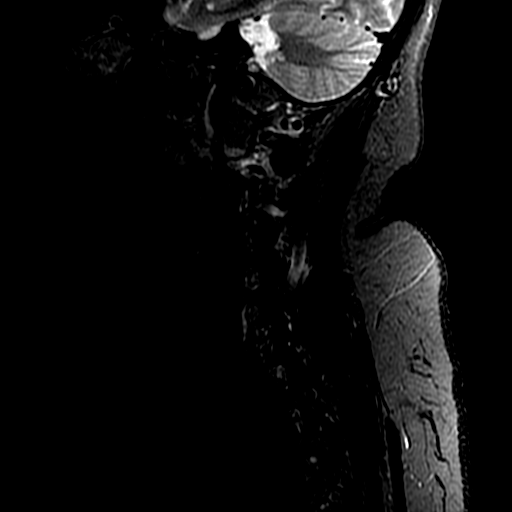
[im 7/13]
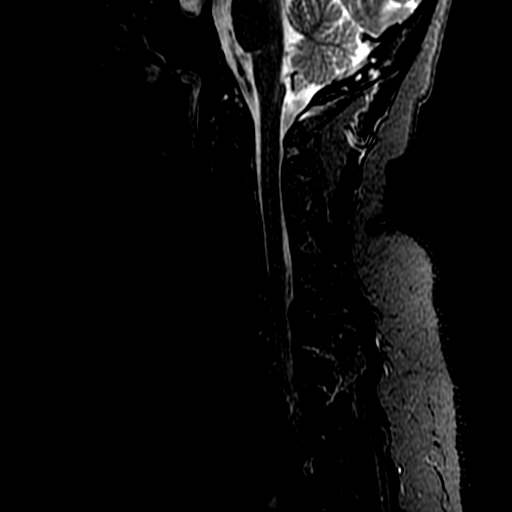
[im 13/13]
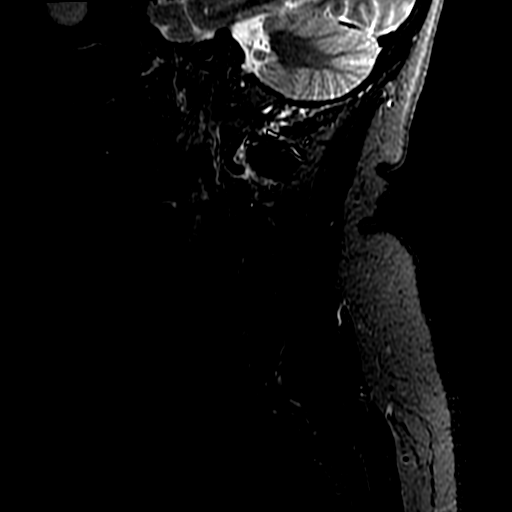

[Series 6: T2 post-contrast · sagittal · 3.0mm · 0.41mm/px · 6 of 13 slices shown]
[im 1/13]
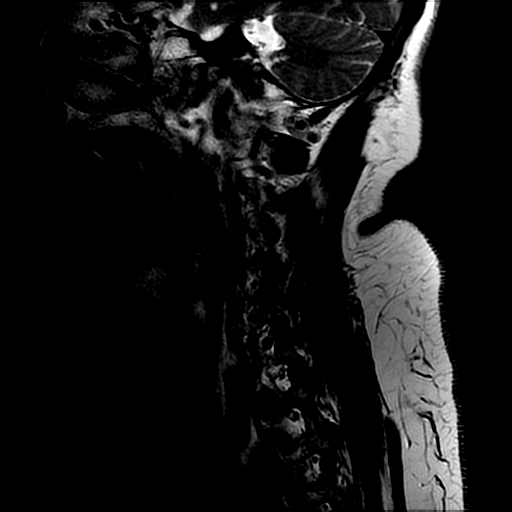
[im 3/13]
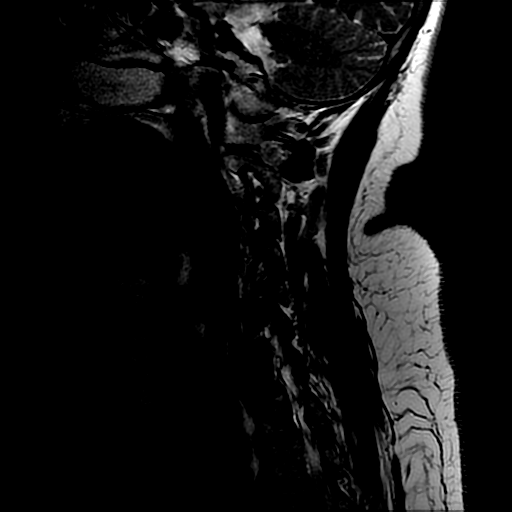
[im 5/13]
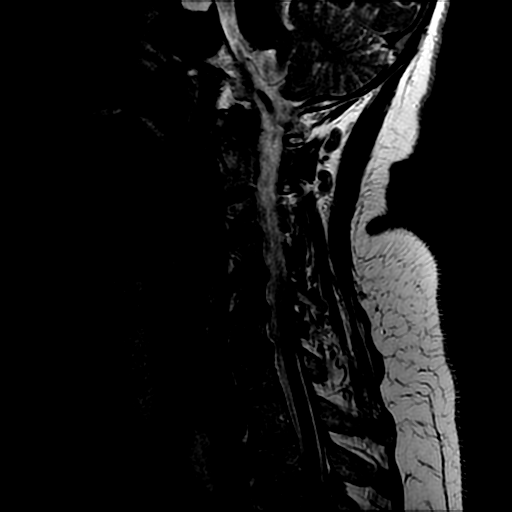
[im 8/13]
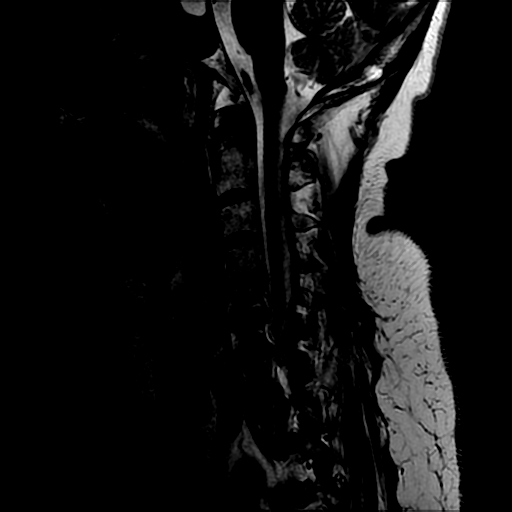
[im 10/13]
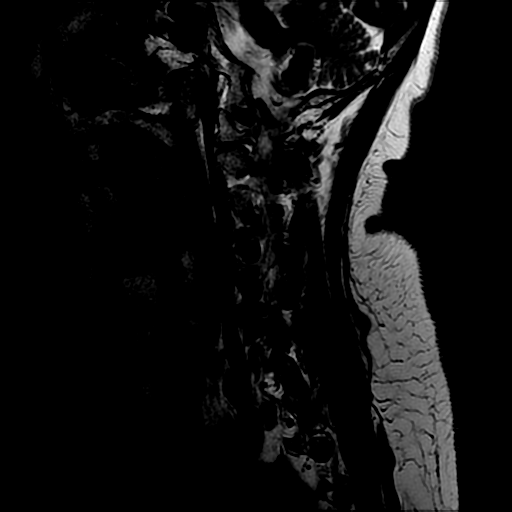
[im 13/13]
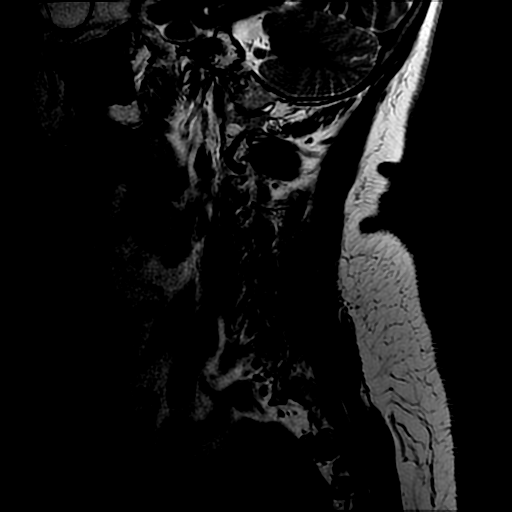

[Series 8: T2 · axial · 3.1mm · 0.35mm/px · z∈[-35,+42]mm · 6 of 29 slices shown]
[im 1/29]
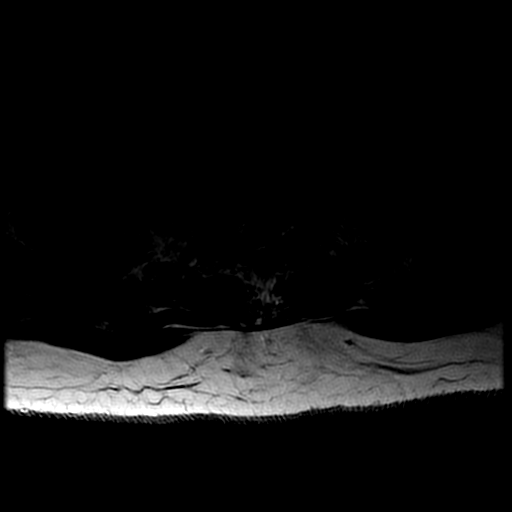
[im 5/29]
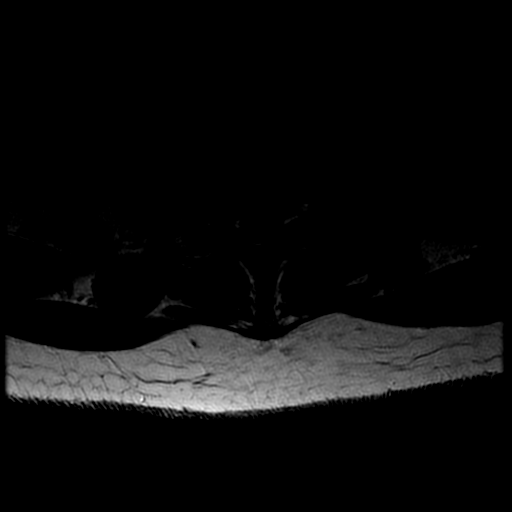
[im 10/29]
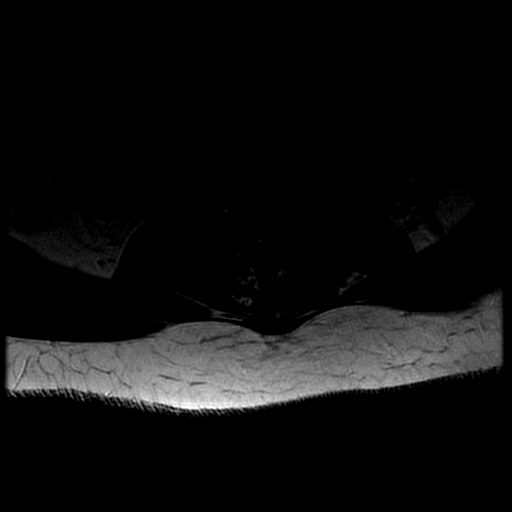
[im 12/29]
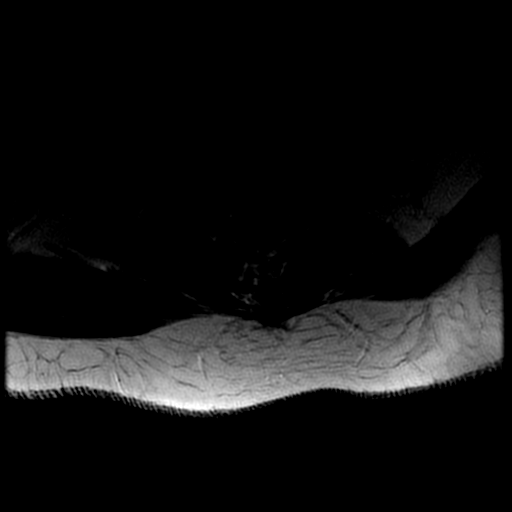
[im 15/29]
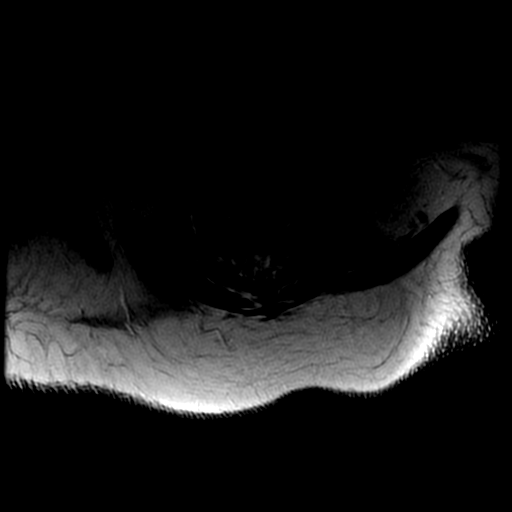
[im 24/29]
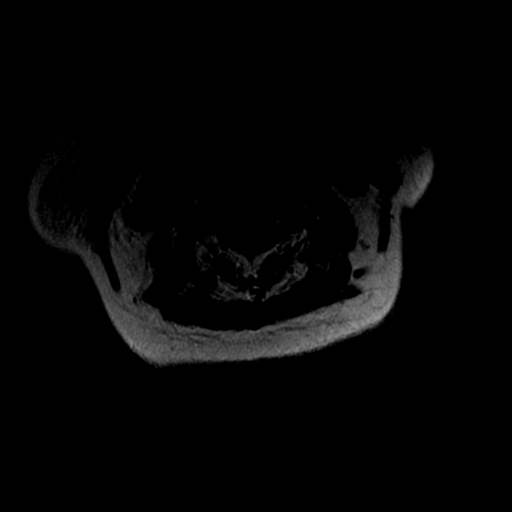

[18 of 48 positions shown; findings below may reference images not displayed]

05/21/2009
cervical spine MR. Brain MR performed same date is dictated
separately.
FINDINGS: Alignment: Curvature cervical spine convex left.

Vertebrae: No worrisome osseous abnormality.

Cord: No focal cervical cord signal abnormality.

Posterior Fossa, vertebral arteries, paraspinal tissues:
Cervicomedullary junction is within normal limits. No abnormal
prevertebral soft tissue swelling. Vertebral arteries and carotid
arteries appear patent.

Mild transverse ligament hypertrophy.

C2-3:  No spinal stenosis or foraminal narrowing.

C3-4:  Minimal bulge.  Minimal narrowing ventral thecal sac.

C4-5: Minimal bulge. Small left paracentral protrusion. Slight
flattening left aspect of the cord.

C5-6: Broad-based disc osteophyte complex. Mild narrowing ventral
thecal sac without cord compression. Uncinate hypertrophy with mild
moderate bilateral foraminal narrowing.

C6-7: Shallow disc osteophyte complex greatest left paracentral
position with slight narrowing left ventral thecal sac without cord
compression.

C7-T1:  No spinal stenosis or foraminal narrowing.
IMPRESSION: Summary of pertinent findings includes:

C3-4 minimal bulge.  Minimal narrowing ventral thecal sac.

C4-5 minimal bulge. Small left paracentral protrusion. Slight
flattening left aspect of the cord.

C5-6 broad-based disc osteophyte complex. Mild narrowing ventral
thecal sac without cord compression. Uncinate hypertrophy with mild
to moderate bilateral foraminal narrowing.

C6-7 shallow disc osteophyte complex greatest left paracentral
position with slight narrowing left ventral thecal sac without cord
compression.

MR brain performed same date is dictated separately.

## 2019-07-11 ENCOUNTER — Ambulatory Visit: Payer: Medicare Other | Attending: Internal Medicine

## 2019-07-11 DIAGNOSIS — Z23 Encounter for immunization: Secondary | ICD-10-CM | POA: Insufficient documentation

## 2019-07-11 NOTE — Progress Notes (Signed)
   Covid-19 Vaccination Clinic  Name:  Stacy Reid    MRN: [id number] DOB: Sep 28, 1932  07/11/2019  Stacy Reid was observed post Covid-19 immunization for 15 minutes without incident. She was provided with Vaccine Information Sheet and instruction to access the V-Safe system.   Stacy Reid was instructed to call 911 with any severe reactions post vaccine: Marland Kitchen Difficulty breathing  . Swelling of face and throat  . A fast heartbeat  . A bad rash all over body  . Dizziness and weakness   Immunizations Administered    Name Date Dose VIS Date Route   Pfizer COVID-19 Vaccine 07/11/2019  4:36 PM 0.3 mL 04/15/2019 Intramuscular   Manufacturer: [employer], [employer]   Lot: [id number]   NDC: [address]-2

## 2019-07-18 ENCOUNTER — Other Ambulatory Visit: Payer: Self-pay | Admitting: Neurology

## 2019-07-18 DIAGNOSIS — Z1231 Encounter for screening mammogram for malignant neoplasm of breast: Secondary | ICD-10-CM

## 2019-08-08 ENCOUNTER — Ambulatory Visit: Payer: Medicare Other | Attending: Internal Medicine

## 2019-08-08 DIAGNOSIS — Z23 Encounter for immunization: Secondary | ICD-10-CM

## 2019-08-08 NOTE — Progress Notes (Signed)
   Covid-19 Vaccination Clinic  Name:  Stacy Reid    MRN: [id number] DOB: 03-Aug-1932  08/08/2019  Stacy Reid was observed post Covid-19 immunization for 15 minutes without incident. She was provided with Vaccine Information Sheet and instruction to access the V-Safe system.   Stacy Reid was instructed to call 911 with any severe reactions post vaccine: Marland Kitchen Difficulty breathing  . Swelling of face and throat  . A fast heartbeat  . A bad rash all over body  . Dizziness and weakness   Immunizations Administered    Name Date Dose VIS Date Route   Pfizer COVID-19 Vaccine 08/08/2019  4:22 PM 0.3 mL 04/15/2019 Intramuscular   Manufacturer: [employer], [employer]   Lot: [id number]   NDC: [address]-7

## 2019-08-11 ENCOUNTER — Ambulatory Visit: Payer: Medicare Other

## 2019-09-26 ENCOUNTER — Ambulatory Visit
Admission: RE | Admit: 2019-09-26 | Discharge: 2019-09-26 | Disposition: A | Discharge status: Home / Self Care | Payer: Medicare Other | Source: Ambulatory Visit | Attending: Neurology | Admitting: Neurology

## 2019-09-26 ENCOUNTER — Other Ambulatory Visit: Payer: Self-pay

## 2019-09-26 DIAGNOSIS — Z1231 Encounter for screening mammogram for malignant neoplasm of breast: Secondary | ICD-10-CM

## 2022-10-20 ENCOUNTER — Ambulatory Visit (INDEPENDENT_AMBULATORY_CARE_PROVIDER_SITE_OTHER): Payer: Medicare Other | Admitting: Pulmonary Disease

## 2022-10-20 ENCOUNTER — Encounter (HOSPITAL_BASED_OUTPATIENT_CLINIC_OR_DEPARTMENT_OTHER): Payer: Self-pay | Admitting: Pulmonary Disease

## 2022-10-20 VITALS — BP 126/82 | HR 62 | Ht 62.0 in | Wt 149.0 lb

## 2022-10-20 DIAGNOSIS — R0683 Snoring: Secondary | ICD-10-CM

## 2022-10-20 NOTE — Patient Instructions (Signed)
Will arrange for a home sleep study Will call to arrange for follow up after sleep study reviewed  

## 2022-10-20 NOTE — Progress Notes (Addendum)
Stacy Reid, Critical Care, and Sleep Medicine  Chief Complaint  Patient presents with   Consult    Referred by PCP for history of snoring and increased fatigue during the day. Denies ever having a sleep study before.     Past Surgical History:  She  has a past surgical history that includes Knee surgery; Eye surgery; Cardiac catheterization; and Back surgery.  Past Medical History:  DM type 2, HLD, HTN, Neuropathy  Constitutional:  BP 126/82   Pulse 62   Ht 5\' 2"  (1.575 m)   Wt 149 lb (67.6 kg)   SpO2 98% Comment: on RA  BMI 27.25 kg/m   Brief Summary:  Stacy Reid is a 89 y.o. female with snoring.      Subjective:   She is here with her daughter who helped with translation.  She snores and gets sleepy during the day.  She has trouble falling asleep and staying asleep.  She wakes up snoring and has trouble breathing at night.  She is a restless sleeper.  She falls asleep watching TV.  She goes to sleep at 9 pm.  She falls asleep in about 20 to 30 minutes.  She wakes up 3to 5 times to use the bathroom.  She gets out of bed at 8 am.  She feels tired in the morning.  She denies morning headache.  She does not use anything to help her fall sleep or stay awake.  She denies sleep walking, sleep talking, bruxism, or nightmares.  There is no history of restless legs.  She denies sleep hallucinations, sleep paralysis, or cataplexy.  The Epworth score is 3 out of 24.   Physical Exam:   Appearance - well kempt   ENMT - no sinus tenderness, no oral exudate, no LAN, Mallampati 4 airway, no stridor, scalloped tongue  Respiratory - equal breath sounds bilaterally, no wheezing or rales  CV - s1s2 regular rate and rhythm, no murmurs  Ext - no clubbing, no edema  Skin - no rashes  Psych - normal mood and affect   Sleep Tests:    Social History:  She  reports that she has never smoked. She has never used smokeless tobacco. She reports that she does not drink  alcohol and does not use drugs.  Family History:  Her family history includes Sleep apnea in her daughter.    Discussion:  She has snoring, sleep disruption, apnea and daytime sleepiness.  She has history of hypertension and diabetes.  I am concerned she could have obstructive sleep apnea.  Assessment/Plan:   Snoring with excessive daytime sleepiness. - will need to arrange for a home sleep study  Obesity. - discussed how weight can impact sleep and risk for sleep disordered breathing - discussed options to assist with weight loss: combination of diet modification, cardiovascular and strength training exercises  Cardiovascular risk. - had an extensive discussion regarding the adverse health consequences related to untreated sleep disordered breathing - specifically discussed the risks for hypertension, coronary artery disease, cardiac dysrhythmias, cerebrovascular disease, and diabetes - lifestyle modification discussed  Safe driving practices. - discussed how sleep disruption can increase risk of accidents, particularly when driving - safe driving practices were discussed  Therapies for obstructive sleep apnea. - if the sleep study shows significant sleep apnea, then various therapies for treatment were reviewed: CPAP, oral appliance, and surgical interventions  Time Spent Involved in Patient Care on Day of Examination:  35 minutes  Follow up:   Patient Instructions  Will arrange for a home sleep study Will call to arrange for follow up after sleep study reviewed   Medication List:   Allergies as of 10/20/2022       Reactions   Lisinopril Cough, Other (See Comments)   Cough        Medication List        Accurate as of October 20, 2022 11:08 AM. If you have any questions, ask your nurse or doctor.          STOP taking these medications    amoxicillin 500 MG capsule Commonly known as: AMOXIL Stopped by: Coralyn Helling, MD   azithromycin 250 MG  tablet Commonly known as: ZITHROMAX Stopped by: Coralyn Helling, MD   cephALEXin 500 MG capsule Commonly known as: KEFLEX Stopped by: Coralyn Helling, MD   meclizine 25 MG tablet Commonly known as: ANTIVERT Stopped by: Coralyn Helling, MD   ondansetron 8 MG disintegrating tablet Commonly known as: Zofran ODT Stopped by: Coralyn Helling, MD   polyethylene glycol powder 17 GM/SCOOP powder Commonly known as: GLYCOLAX/MIRALAX Stopped by: Coralyn Helling, MD       TAKE these medications    amLODipine 5 MG tablet Commonly known as: NORVASC Take 5 mg by mouth daily.   carvedilol 3.125 MG tablet Commonly known as: COREG Take 3.125 mg by mouth 2 (two) times daily with a meal.   empagliflozin 25 MG Tabs tablet Commonly known as: JARDIANCE Take 25 mg by mouth daily.   gabapentin 100 MG capsule Commonly known as: NEURONTIN Take 100 mg by mouth 3 (three) times daily.   glipiZIDE 10 MG 24 hr tablet Commonly known as: GLUCOTROL XL Take 10 mg by mouth daily.   losartan 50 MG tablet Commonly known as: COZAAR Take 50 mg by mouth daily.   metFORMIN 500 MG tablet Commonly known as: GLUCOPHAGE 500 mg daily with breakfast. What changed: Another medication with the same name was removed. Continue taking this medication, and follow the directions you see here. Changed by: Coralyn Helling, MD   omeprazole 20 MG capsule Commonly known as: PRILOSEC Take 20 mg by mouth daily.   pravastatin 40 MG tablet Commonly known as: PRAVACHOL Take 40 mg by mouth daily.   Tradjenta 5 MG Tabs tablet Generic drug: linagliptin Take 5 mg by mouth daily.        Signature:  Coralyn Helling, MD [facility]/Critical Care Pager - [phone] 10/20/2022, 11:08 AM

## 2022-12-13 ENCOUNTER — Telehealth: Payer: Self-pay | Admitting: Pulmonary Disease

## 2022-12-13 ENCOUNTER — Encounter (INDEPENDENT_AMBULATORY_CARE_PROVIDER_SITE_OTHER): Payer: Medicare Other

## 2022-12-13 DIAGNOSIS — G4733 Obstructive sleep apnea (adult) (pediatric): Secondary | ICD-10-CM | POA: Diagnosis not present

## 2022-12-13 DIAGNOSIS — R0683 Snoring: Secondary | ICD-10-CM

## 2022-12-13 NOTE — Telephone Encounter (Signed)
Call patient  Sleep study result  Date of study: 11/18/2022  Impression: Mild obstructive sleep apnea Moderate oxygen desaturations  Recommendation: Options of treatment for mild obstructive sleep apnea will include  1.  CPAP therapy if there is significant daytime sleepiness or other comorbidities including history of CVA or cardiac disease  -If CPAP is chosen as an option of treatment auto titrating CPAP with a pressure setting of 5-15 will be appropriate  2.  Watchful waiting with emphasis on weight loss measures, sleep position modification to optimize lateral sleep, elevating the head of the bed by about 30 degrees may also help.  3.  An oral device may be fashioned for the treatment of mild sleep disordered breathing, will involve referral to dentist.   Follow-up as previously scheduled

## 2022-12-25 NOTE — Telephone Encounter (Signed)
Spoke with patient's daughter Lucille Passy regarding sleep study result's  Sleep study result   Date of study: 11/18/2022   Impression: Mild obstructive sleep apnea Moderate oxygen desaturations   Recommendation: Options of treatment for mild obstructive sleep apnea will include   1.  CPAP therapy if there is significant daytime sleepiness or other comorbidities including history of CVA or cardiac disease   -If CPAP is chosen as an option of treatment auto titrating CPAP with a pressure setting of 5-15 will be appropriate   2.  Watchful waiting with emphasis on weight loss measures, sleep position modification to optimize lateral sleep, elevating the head of the bed by about 30 degrees may also help.   3.  An oral device may be fashioned for the treatment of mild sleep disordered breathing, will involve referral to dentist.     Follow-up as previously scheduled  Patient was scheduled with Tammy for a f/u to go over sleep study result's   Nothing else further needed.

## 2022-12-26 ENCOUNTER — Encounter: Payer: Self-pay | Admitting: Adult Health

## 2022-12-26 ENCOUNTER — Ambulatory Visit: Payer: Medicare Other | Admitting: Adult Health

## 2022-12-26 VITALS — BP 118/64 | HR 68 | Ht 61.0 in | Wt 149.0 lb

## 2022-12-26 DIAGNOSIS — E669 Obesity, unspecified: Secondary | ICD-10-CM | POA: Diagnosis not present

## 2022-12-26 DIAGNOSIS — G4733 Obstructive sleep apnea (adult) (pediatric): Secondary | ICD-10-CM | POA: Diagnosis not present

## 2022-12-26 NOTE — Progress Notes (Signed)
Reviewed and agree with assessment/plan.   Coralyn Helling, MD [facility] Pulmonary/Critical Care 12/26/2022, 12:21 PM Pager:  [phone]

## 2022-12-26 NOTE — Patient Instructions (Signed)
Begin CPAP At bedtime, wear all night for at least 6hrs or more  Work on healthy weight loss  Refer to Healthy weight and wellness  Follow up in 3 months and As needed

## 2022-12-26 NOTE — Progress Notes (Signed)
@Patient  ID: Stacy Reid, female    DOB: 1932-11-21, 89 y.o.   MRN: [id number]  Chief Complaint  Patient presents with   Follow-up    Referring provider: Iven Finn., MD  HPI: 89 year old female seen for sleep consult on October 20, 2022 for snoring found to have mild obstructive sleep apnea Patient is from [place] Medical history significant for diabetes and hypertension  TEST/EVENTS :  11/18/2022 HST Mild obstructive sleep apnea Moderate oxygen desaturations  12/26/2022 Follow up ; OSA  Patient returns for a 15-month follow-up.  Patient was seen last visit for a sleep consult for snoring and daytime sleepiness.  She was set up for home sleep study that was done November 18, 2022 that shows mild obstructive sleep apnea with moderate oxygen desaturations.  We discussed her sleep study results in detail with patient and her daughter.  Patient does speak [place] and daughter translated today.  Patient would like to proceed with CPAP therapy.  Has significant symptom burden with daytime sleepiness and restless sleep.  She does have underlying diabetes and hypertension.  Is on Neurontin. We discussed healthy weight loss today.  With her diabetes they would like a referral to help with weight loss and diet recommendations.   Allergies  Allergen Reactions   Lisinopril Cough and Other (See Comments)    Cough    Immunization History  Administered Date(s) Administered   Fluad Quad(high Dose 65+) 02/02/2017, 02/01/2020   Influenza Split 04/04/2011, 05/19/2012, 03/09/2013   Influenza, High Dose Seasonal PF 04/05/2015   PFIZER(Purple Top)SARS-COV-2 Vaccination 07/11/2019, 08/08/2019   Pneumococcal Conjugate,unspecified 04/05/2015   Pneumococcal Conjugate-13 04/05/2015, 06/07/2018   Pneumococcal Polysaccharide-23 02/02/2017    Past Medical History:  Diagnosis Date   Diabetes mellitus without complication (HCC)    Hyperlipidemia    Hypertension     Tobacco History: Social History    Tobacco Use  Smoking Status Never  Smokeless Tobacco Never   Counseling given: Not Answered   Outpatient Medications Prior to Visit  Medication Sig Dispense Refill   amLODipine (NORVASC) 5 MG tablet Take 5 mg by mouth daily.     carvedilol (COREG) 3.125 MG tablet Take 3.125 mg by mouth 2 (two) times daily with a meal.     empagliflozin (JARDIANCE) 25 MG TABS tablet Take 25 mg by mouth daily.     gabapentin (NEURONTIN) 100 MG capsule Take 100 mg by mouth 3 (three) times daily.     glipiZIDE (GLUCOTROL XL) 5 MG 24 hr tablet Take 5 mg by mouth daily with breakfast.     losartan (COZAAR) 50 MG tablet Take 50 mg by mouth daily.     metFORMIN (GLUCOPHAGE) 500 MG tablet 500 mg daily with breakfast.     omeprazole (PRILOSEC) 20 MG capsule Take 20 mg by mouth daily.     pravastatin (PRAVACHOL) 40 MG tablet Take 40 mg by mouth daily.     TRADJENTA 5 MG TABS tablet Take 5 mg by mouth daily.     glipiZIDE (GLUCOTROL XL) 10 MG 24 hr tablet Take 10 mg by mouth daily.     No facility-administered medications prior to visit.     Review of Systems:   Constitutional:   No  weight loss, night sweats,  Fevers, chills, fatigue, or  lassitude.  HEENT:   No headaches,  Difficulty swallowing,  Tooth/dental problems, or  Sore throat,                No sneezing, itching, ear ache,  nasal congestion, post nasal drip,   CV:  No chest pain,  Orthopnea, PND, swelling in lower extremities, anasarca, dizziness, palpitations, syncope.   GI  No heartburn, indigestion, abdominal pain, nausea, vomiting, diarrhea, change in bowel habits, loss of appetite, bloody stools.   Resp: No shortness of breath with exertion or at rest.  No excess mucus, no productive cough,  No non-productive cough,  No coughing up of blood.  No change in color of mucus.  No wheezing.  No chest wall deformity  Skin: no rash or lesions.  GU: no dysuria, change in color of urine, no urgency or frequency.  No flank pain, no hematuria    MS:  No joint pain or swelling.  No decreased range of motion.  No back pain.    Physical Exam  BP 118/64 (BP Location: Left Arm, Patient Position: Sitting, Cuff Size: Normal)   Pulse 68   Ht 5\' 1"  (1.549 m)   Wt 149 lb (67.6 kg)   SpO2 99%   BMI 28.15 kg/m   GEN: A/Ox3; pleasant , NAD, well nourished    HEENT:  [place]/AT,  NOSE-clear, THROAT-clear, no lesions, no postnasal drip or exudate noted. Class 3 MP airway   NECK:  Supple w/ fair ROM; no JVD; normal carotid impulses w/o bruits; no thyromegaly or nodules palpated; no lymphadenopathy.    RESP  Clear  P & A; w/o, wheezes/ rales/ or rhonchi. no accessory muscle use, no dullness to percussion  CARD:  RRR, no m/r/g, no peripheral edema, pulses intact, no cyanosis or clubbing.  GI:   Soft & nt; nml bowel sounds; no organomegaly or masses detected.   Musco: Warm bil, no deformities or joint swelling noted.   Neuro: alert, no focal deficits noted.    Skin: Warm, no lesions or rashes    Lab Results:     BNP No results found for: "BNP"  ProBNP No results found for: "PROBNP"  Imaging: No results found.  Administration History     None           No data to display          No results found for: "NITRICOXIDE"      Assessment & Plan:   OSA (obstructive sleep apnea) Mild obstructive sleep apnea with significant symptom burden.  Patient education was given.  Will begin auto CPAP 5 to 15 cm H2O - discussed how weight can impact sleep and risk for sleep disordered breathing - discussed options to assist with weight loss: combination of diet modification, cardiovascular and strength training exercises   - had an extensive discussion regarding the adverse health consequences related to untreated sleep disordered breathing - specifically discussed the risks for hypertension, coronary artery disease, cardiac dysrhythmias, cerebrovascular disease, and diabetes - lifestyle modification discussed   -  discussed how sleep disruption can increase risk of accidents, particularly when driving - safe driving practices were discussed   Plan  Patient Instructions  Begin CPAP At bedtime, wear all night for at least 6hrs or more  Work on healthy weight loss  Refer to Healthy weight and wellness  Follow up in 3 months and As needed      Obesity Obesity with BMI at 28 with underlying diabetes.  Referral to healthy weight and wellness.     Rubye Oaks, NP 12/26/2022

## 2022-12-26 NOTE — Assessment & Plan Note (Signed)
Mild obstructive sleep apnea with significant symptom burden.  Patient education was given.  Will begin auto CPAP 5 to 15 cm H2O - discussed how weight can impact sleep and risk for sleep disordered breathing - discussed options to assist with weight loss: combination of diet modification, cardiovascular and strength training exercises   - had an extensive discussion regarding the adverse health consequences related to untreated sleep disordered breathing - specifically discussed the risks for hypertension, coronary artery disease, cardiac dysrhythmias, cerebrovascular disease, and diabetes - lifestyle modification discussed   - discussed how sleep disruption can increase risk of accidents, particularly when driving - safe driving practices were discussed   Plan  Patient Instructions  Begin CPAP At bedtime, wear all night for at least 6hrs or more  Work on healthy weight loss  Refer to Healthy weight and wellness  Follow up in 3 months and As needed

## 2022-12-26 NOTE — Assessment & Plan Note (Signed)
Obesity with BMI at 28 with underlying diabetes.  Referral to healthy weight and wellness.

## 2023-04-06 ENCOUNTER — Encounter: Payer: Self-pay | Admitting: Adult Health

## 2023-04-06 ENCOUNTER — Ambulatory Visit: Payer: Medicare Other | Admitting: Adult Health

## 2023-05-26 ENCOUNTER — Emergency Department (HOSPITAL_COMMUNITY)
Admission: EM | Admit: 2023-05-26 | Discharge: 2023-05-26 | Disposition: A | Discharge status: Home / Self Care | Payer: Medicare Other | Attending: Emergency Medicine | Admitting: Emergency Medicine

## 2023-05-26 ENCOUNTER — Other Ambulatory Visit: Payer: Self-pay

## 2023-05-26 ENCOUNTER — Emergency Department (HOSPITAL_COMMUNITY): Payer: Medicare Other

## 2023-05-26 DIAGNOSIS — I2510 Atherosclerotic heart disease of native coronary artery without angina pectoris: Secondary | ICD-10-CM | POA: Insufficient documentation

## 2023-05-26 DIAGNOSIS — R079 Chest pain, unspecified: Secondary | ICD-10-CM | POA: Diagnosis present

## 2023-05-26 DIAGNOSIS — Z7984 Long term (current) use of oral hypoglycemic drugs: Secondary | ICD-10-CM | POA: Diagnosis not present

## 2023-05-26 DIAGNOSIS — E119 Type 2 diabetes mellitus without complications: Secondary | ICD-10-CM | POA: Diagnosis not present

## 2023-05-26 DIAGNOSIS — Z79899 Other long term (current) drug therapy: Secondary | ICD-10-CM | POA: Diagnosis not present

## 2023-05-26 DIAGNOSIS — I10 Essential (primary) hypertension: Secondary | ICD-10-CM | POA: Diagnosis not present

## 2023-05-26 LAB — BASIC METABOLIC PANEL
Anion gap: 9 (ref 5–15)
BUN: 18 mg/dL (ref 8–23)
CO2: 25 mmol/L (ref 22–32)
Calcium: 9.3 mg/dL (ref 8.9–10.3)
Chloride: 103 mmol/L (ref 98–111)
Creatinine, Ser: 1.2 mg/dL — ABNORMAL HIGH (ref 0.44–1.00)
GFR, Estimated: 44 mL/min — ABNORMAL LOW (ref >60)
Glucose, Bld: 162 mg/dL — ABNORMAL HIGH (ref 70–99)
Potassium: 4.4 mmol/L (ref 3.5–5.1)
Sodium: 137 mmol/L (ref 135–145)

## 2023-05-26 LAB — CBC
HCT: 44 % (ref 36.0–46.0)
Hemoglobin: 14.4 g/dL (ref 12.0–15.0)
MCH: 29.7 pg (ref 26.0–34.0)
MCHC: 32.7 g/dL (ref 30.0–36.0)
MCV: 90.7 fL (ref 80.0–100.0)
Platelets: 275 10*3/uL (ref 150–400)
RBC: 4.85 MIL/uL (ref 3.87–5.11)
RDW: 12.7 % (ref 11.5–15.5)
WBC: 7.9 10*3/uL (ref 4.0–10.5)
nRBC: 0 % (ref 0.0–0.2)

## 2023-05-26 LAB — URINALYSIS, ROUTINE W REFLEX MICROSCOPIC
Bilirubin Urine: NEGATIVE
Glucose, UA: NEGATIVE mg/dL
Hgb urine dipstick: NEGATIVE
Ketones, ur: NEGATIVE mg/dL
Leukocytes,Ua: NEGATIVE
Nitrite: NEGATIVE
Protein, ur: NEGATIVE mg/dL
Specific Gravity, Urine: 1.011 (ref 1.005–1.030)
pH: 7 (ref 5.0–8.0)

## 2023-05-26 LAB — D-DIMER, QUANTITATIVE: D-Dimer, Quant: 0.35 ug{FEU}/mL (ref 0.00–0.50)

## 2023-05-26 LAB — TROPONIN I (HIGH SENSITIVITY): Troponin I (High Sensitivity): 7 ng/L (ref <18)

## 2023-05-26 NOTE — ED Provider Notes (Signed)
[place] EMERGENCY DEPARTMENT AT [facility] Provider Note   CSN: [id number] Arrival date & time: 05/26/23  1006     History  Chief Complaint  Patient presents with   Chest Pain   Shortness of Breath   Dizziness    Stacy Reid is a 89 y.o. female.  Patient is an 89 year old female with past medical history of CAD, hypertension and diabetes presenting to the emergency department with chest pain.  Patient is here with her daughter who reports yesterday afternoon she started to develop right-sided chest pain.  She states that the pain is worse with taking deep breaths and that she feels short of breath.  She states that the pain has been constant and will have an intermittent sharp stabbing pain.  She states that nothing in particular brings on the stabbing pain and that she was not doing anything at the time of the pain starting.  She states that she has had some mild dizziness upon standing.  She denies any recent fever or cough but did have a URI about 2 weeks ago that her symptoms have now all resolved.  She denies any lower extremity swelling.  She denies any associated numbness or weakness.  The history is provided by the patient and a relative. No language interpreter was used (Patient declined, requested daughter to translate).  Chest Pain Associated symptoms: dizziness and shortness of breath   Shortness of Breath Associated symptoms: chest pain   Dizziness Associated symptoms: chest pain and shortness of breath        Home Medications Prior to Admission medications   Medication Sig Start Date End Date Taking? Authorizing Provider  amLODipine (NORVASC) 5 MG tablet Take 5 mg by mouth daily. 03/25/18   [provider]  carvedilol (COREG) 3.125 MG tablet Take 3.125 mg by mouth 2 (two) times daily with a meal. 09/21/15   [provider]  empagliflozin (JARDIANCE) 25 MG TABS tablet Take 25 mg by mouth daily. 08/21/22   [provider]   gabapentin (NEURONTIN) 100 MG capsule Take 100 mg by mouth 3 (three) times daily.    [provider]  glipiZIDE (GLUCOTROL XL) 5 MG 24 hr tablet Take 5 mg by mouth daily with breakfast. 08/28/15   [provider]  losartan (COZAAR) 50 MG tablet Take 50 mg by mouth daily.    [provider]  metFORMIN (GLUCOPHAGE) 500 MG tablet 500 mg daily with breakfast. 10/30/14   [provider]  omeprazole (PRILOSEC) 20 MG capsule Take 20 mg by mouth daily.    [provider]  pravastatin (PRAVACHOL) 40 MG tablet Take 40 mg by mouth daily.    [provider]  TRADJENTA 5 MG TABS tablet Take 5 mg by mouth daily.    [provider]      Allergies    Lisinopril    Review of Systems   Review of Systems  Respiratory:  Positive for shortness of breath.   Cardiovascular:  Positive for chest pain.  Neurological:  Positive for dizziness.    Physical Exam Updated Vital Signs BP (!) 177/84   Pulse (!) 59   Temp 98.3 F (36.8 C)   Resp 13   SpO2 99%  Physical Exam Vitals and nursing note reviewed.  Constitutional:      General: She is not in acute distress.    Appearance: She is well-developed.  HENT:     Head: Normocephalic and atraumatic.  Eyes:  Extraocular Movements: Extraocular movements intact.     Pupils: Pupils are equal, round, and reactive to light.     Comments: No nystagmus  Cardiovascular:     Rate and Rhythm: Normal rate and regular rhythm.     Pulses:          Radial pulses are 2+ on the right side and 2+ on the left side.     Heart sounds: Normal heart sounds.  Pulmonary:     Effort: Pulmonary effort is normal.     Breath sounds: Normal breath sounds.  Chest:     Chest wall: No tenderness.  Abdominal:     Palpations: Abdomen is soft.     Tenderness: There is no abdominal tenderness.  Musculoskeletal:        General: Normal range of motion.     Cervical back: Normal range of motion and neck supple.     Right  lower leg: No edema.     Left lower leg: No edema.  Skin:    General: Skin is warm and dry.  Neurological:     General: No focal deficit present.     Mental Status: She is alert and oriented to person, place, and time.     Cranial Nerves: No cranial nerve deficit.     Motor: No weakness.  Psychiatric:        Mood and Affect: Mood normal.        Behavior: Behavior normal.     ED Results / Procedures / Treatments   Labs (all labs ordered are listed, but only abnormal results are displayed) Labs Reviewed  BASIC METABOLIC PANEL - Abnormal; Notable for the following components:      Result Value   Glucose, Bld 162 (*)    Creatinine, Ser 1.20 (*)    GFR, Estimated 44 (*)    All other components within normal limits  CBC  URINALYSIS, ROUTINE W REFLEX MICROSCOPIC  D-DIMER, QUANTITATIVE  TROPONIN I (HIGH SENSITIVITY)  TROPONIN I (HIGH SENSITIVITY)    EKG EKG Interpretation Date/Time:  Tuesday May 26 2023 10:22:04 EST Ventricular Rate:  67 PR Interval:  202 QRS Duration:  80 QT Interval:  426 QTC Calculation: 450 R Axis:   -25  Text Interpretation: Normal sinus rhythm Nonspecific T wave abnormality No significant change since last tracing When compared with ECG of 05-Dec-2013 11:20, PREVIOUS ECG IS PRESENT Confirmed by Gwyneth Sprout ([id number]) on 05/26/2023 11:02:39 AM  Radiology DG Chest 2 View Result Date: 05/26/2023 CLINICAL DATA:  89 year old female with right side chest pain, dizziness, shortness of breath. EXAM: CHEST - 2 VIEW COMPARISON:  Chest radiographs 12/05/2013 and earlier. FINDINGS: PA and lateral views 1042 hours. Larger lung volumes. Stable mild cardiomegaly. Other mediastinal contours are within normal limits. Visualized tracheal air column is within normal limits. Abdominal Calcified aortic atherosclerosis. No pneumothorax, pulmonary edema, pleural effusion or confluent lung opacity. No acute osseous abnormality identified. Negative visible bowel gas.  IMPRESSION: No acute cardiopulmonary abnormality. Aortic Atherosclerosis (ICD10-I70.0). Electronically Signed   By: Odessa Fleming M.D.   On: 05/26/2023 11:08    Procedures Procedures    Medications Ordered in ED Medications - No data to display  ED Course/ Medical Decision Making/ A&P                                 Medical Decision Making This patient presents to the ED with chief complaint(s) of chest pain  with pertinent past medical history of CAD, hypertension, diabetes which further complicates the presenting complaint. The complaint involves an extensive differential diagnosis and also carries with it a high risk of complications and morbidity.    The differential diagnosis includes ACS, arrhythmia, anemia, pneumonia, pneumothorax, pulmonary edema, pleural effusion, pericarditis, myocarditis, considering  Additional history obtained: Additional history obtained from family Records reviewed Care Everywhere/External Records  ED Course and Reassessment: On patient's arrival she is mildly hypertensive otherwise hemodynamically stable in no acute distress.  Daughter reports she did not take her home medications this morning.  EKG on arrival showed normal sinus rhythm without acute ischemic changes.  She was initially evaluated in triage and had labs including troponin and D-dimer as well as chest x-ray performed.  Labs showed mild increased creatinine from baseline otherwise negative troponin and normal D-dimer making PE unlikely.  Symptoms have been ongoing since yesterday afternoon so single troponin is sufficient.  Chest x-ray is without acute disease.  Unclear etiology of chest pain though low risk for acute ACS with symptoms and reassuring workup and was recommended outpatient cardiology follow-up.  She was given strict return precautions.  Independent labs interpretation:  The following labs were independently interpreted: Mild increased creatinine otherwise within normal  range  Independent visualization of imaging: - I independently visualized the following imaging with scope of interpretation limited to determining acute life threatening conditions related to emergency care: Chest x-ray, which revealed no acute disease  Consultation: - Consulted or discussed management/test interpretation w/ external professional: N/A  Consideration for admission or further workup: Patient has no emergent conditions requiring admission or further work-up at this time and is stable for discharge home with primary care and cardiology follow-up  Social Determinants of health: N/A    Amount and/or Complexity of Data Reviewed Labs: ordered. Radiology: ordered.          Final Clinical Impression(s) / ED Diagnoses Final diagnoses:  Nonspecific chest pain    Rx / DC Orders ED Discharge Orders          Ordered    Ambulatory referral to Cardiology       Comments: If you have not heard from the Cardiology office within the next 72 hours please call [phone].   05/26/23 1412              Elayne Snare K, DO 05/26/23 1416

## 2023-05-26 NOTE — Discharge Instructions (Addendum)
You were seen in the emergency department for your chest pain.  Your workup showed no signs of heart attack, blood clots or abnormalities within your lungs.  It is unclear what is causing your pain at this time but we have given you a referral to follow-up with cardiology. You can take Tylenol as needed. Make sure you are taking your home blood pressure medicines as well as your blood pressure was high in the ER.  You should return to the emergency department if you are having significantly worsening chest pain, severe shortness of breath, you pass out or if you have any other new or concerning symptoms.

## 2023-05-26 NOTE — ED Provider Triage Note (Signed)
Emergency Medicine Provider Triage Evaluation Note  Stacy Reid , a 89 y.o. female  was evaluated in triage.  Pt complains of chest pain in the right side of her chest that started yesterday while she was at rest.  She has had persistent pain but at times it is more severe as well as shortness of breath and some dizziness.  2 weeks ago patient had URI symptoms with a cough but that has mostly resolved.  Prior history of heart attack years ago.  No recent travel.  Review of Systems  Positive: Chest pain, shortness of breath, lightheadedness Negative: No abdominal pain, nausea vomiting, leg swelling, recent travel or history of DVT/PE  Physical Exam  BP (!) 157/88 (BP Location: Right Arm)   Pulse 62   Temp 98.3 F (36.8 C)   SpO2 100%  Gen:   Awake, no distress   Resp:  Normal effort  MSK:   Moves extremities without difficulty no swelling in the lower extremities Other:  Cardiac exam with regular rate and rhythm and no murmurs  Medical Decision Making  Medically screening exam initiated at 11:32 AM.  Appropriate orders placed.  Envi T Nunnelley was informed that the remainder of the evaluation will be completed by another provider, this initial triage assessment does not replace that evaluation, and the importance of remaining in the ED until their evaluation is complete.     Gwyneth Sprout, MD 05/26/23 [phone]

## 2023-05-26 NOTE — ED Triage Notes (Signed)
Daughter stated, My mom has chest pain with SOB with some dizziness started yesterday, she does have a hx of heart attack.
